# Patient Record
Sex: Male | Born: 1960
Health system: Southern US, Community
[De-identification: ages and names within clinical notes are randomized; demographics above are authoritative.]

## PROBLEM LIST (undated history)

## (undated) DIAGNOSIS — C801 Malignant (primary) neoplasm, unspecified: Secondary | ICD-10-CM

## (undated) DIAGNOSIS — I10 Essential (primary) hypertension: Secondary | ICD-10-CM

## (undated) DIAGNOSIS — H269 Unspecified cataract: Secondary | ICD-10-CM

## (undated) DIAGNOSIS — E11319 Type 2 diabetes mellitus with unspecified diabetic retinopathy without macular edema: Secondary | ICD-10-CM

## (undated) DIAGNOSIS — H35039 Hypertensive retinopathy, unspecified eye: Secondary | ICD-10-CM

## (undated) DIAGNOSIS — E119 Type 2 diabetes mellitus without complications: Secondary | ICD-10-CM

## (undated) HISTORY — DX: Essential (primary) hypertension: I10

## (undated) HISTORY — DX: Unspecified cataract: H26.9

## (undated) HISTORY — PX: CARPAL TUNNEL WITH CUBITAL TUNNEL: SHX5608

## (undated) HISTORY — DX: Malignant (primary) neoplasm, unspecified: C80.1

## (undated) HISTORY — DX: Hypertensive retinopathy, unspecified eye: H35.039

## (undated) HISTORY — PX: MOHS SURGERY: SHX181

## (undated) HISTORY — PX: ACHILLES TENDON SURGERY: SHX542

## (undated) HISTORY — DX: Type 2 diabetes mellitus with unspecified diabetic retinopathy without macular edema: E11.319

## (undated) HISTORY — PX: ANKLE SURGERY: SHX546

---

## 2018-10-17 DIAGNOSIS — Z20828 Contact with and (suspected) exposure to other viral communicable diseases: Secondary | ICD-10-CM | POA: Diagnosis not present

## 2018-10-29 DIAGNOSIS — C4442 Squamous cell carcinoma of skin of scalp and neck: Secondary | ICD-10-CM | POA: Diagnosis not present

## 2018-10-29 DIAGNOSIS — F5221 Male erectile disorder: Secondary | ICD-10-CM | POA: Diagnosis not present

## 2018-10-29 DIAGNOSIS — C4491 Basal cell carcinoma of skin, unspecified: Secondary | ICD-10-CM | POA: Diagnosis not present

## 2018-10-29 DIAGNOSIS — E1169 Type 2 diabetes mellitus with other specified complication: Secondary | ICD-10-CM | POA: Diagnosis not present

## 2018-10-29 DIAGNOSIS — Z0000 Encounter for general adult medical examination without abnormal findings: Secondary | ICD-10-CM | POA: Diagnosis not present

## 2018-10-29 DIAGNOSIS — I10 Essential (primary) hypertension: Secondary | ICD-10-CM | POA: Diagnosis not present

## 2020-05-06 DIAGNOSIS — U071 COVID-19: Secondary | ICD-10-CM | POA: Diagnosis not present

## 2020-06-23 ENCOUNTER — Other Ambulatory Visit: Payer: Self-pay | Admitting: Family Medicine

## 2020-06-23 DIAGNOSIS — E119 Type 2 diabetes mellitus without complications: Secondary | ICD-10-CM | POA: Diagnosis not present

## 2020-06-23 DIAGNOSIS — K219 Gastro-esophageal reflux disease without esophagitis: Secondary | ICD-10-CM | POA: Diagnosis not present

## 2020-07-01 ENCOUNTER — Other Ambulatory Visit: Payer: Self-pay

## 2020-07-22 ENCOUNTER — Ambulatory Visit
Admission: RE | Admit: 2020-07-22 | Discharge: 2020-07-22 | Disposition: A | Discharge status: Home / Self Care | Payer: BC Managed Care – PPO | Source: Ambulatory Visit | Attending: Family Medicine | Admitting: Family Medicine

## 2020-07-22 ENCOUNTER — Other Ambulatory Visit: Payer: Self-pay

## 2020-07-22 DIAGNOSIS — K219 Gastro-esophageal reflux disease without esophagitis: Secondary | ICD-10-CM | POA: Diagnosis not present

## 2020-07-29 DIAGNOSIS — Z7984 Long term (current) use of oral hypoglycemic drugs: Secondary | ICD-10-CM | POA: Diagnosis not present

## 2020-07-29 DIAGNOSIS — Z0000 Encounter for general adult medical examination without abnormal findings: Secondary | ICD-10-CM | POA: Diagnosis not present

## 2020-07-29 DIAGNOSIS — E119 Type 2 diabetes mellitus without complications: Secondary | ICD-10-CM | POA: Diagnosis not present

## 2020-07-29 DIAGNOSIS — Z79899 Other long term (current) drug therapy: Secondary | ICD-10-CM | POA: Diagnosis not present

## 2020-07-29 DIAGNOSIS — Z125 Encounter for screening for malignant neoplasm of prostate: Secondary | ICD-10-CM | POA: Diagnosis not present

## 2020-08-06 DIAGNOSIS — E1165 Type 2 diabetes mellitus with hyperglycemia: Secondary | ICD-10-CM | POA: Diagnosis not present

## 2020-08-06 DIAGNOSIS — Z794 Long term (current) use of insulin: Secondary | ICD-10-CM | POA: Diagnosis not present

## 2020-08-06 DIAGNOSIS — Z6833 Body mass index (BMI) 33.0-33.9, adult: Secondary | ICD-10-CM | POA: Diagnosis not present

## 2020-08-06 DIAGNOSIS — E1169 Type 2 diabetes mellitus with other specified complication: Secondary | ICD-10-CM | POA: Diagnosis not present

## 2020-08-10 ENCOUNTER — Ambulatory Visit: Payer: BC Managed Care – PPO | Admitting: Endocrinology

## 2020-09-29 DIAGNOSIS — Z6833 Body mass index (BMI) 33.0-33.9, adult: Secondary | ICD-10-CM | POA: Diagnosis not present

## 2020-09-29 DIAGNOSIS — E1165 Type 2 diabetes mellitus with hyperglycemia: Secondary | ICD-10-CM | POA: Diagnosis not present

## 2020-09-29 DIAGNOSIS — E1169 Type 2 diabetes mellitus with other specified complication: Secondary | ICD-10-CM | POA: Diagnosis not present

## 2020-09-29 DIAGNOSIS — E1141 Type 2 diabetes mellitus with diabetic mononeuropathy: Secondary | ICD-10-CM | POA: Diagnosis not present

## 2020-09-29 DIAGNOSIS — Z794 Long term (current) use of insulin: Secondary | ICD-10-CM | POA: Diagnosis not present

## 2020-10-01 DIAGNOSIS — H25812 Combined forms of age-related cataract, left eye: Secondary | ICD-10-CM | POA: Diagnosis not present

## 2020-10-01 DIAGNOSIS — H2511 Age-related nuclear cataract, right eye: Secondary | ICD-10-CM | POA: Diagnosis not present

## 2020-10-01 DIAGNOSIS — H10413 Chronic giant papillary conjunctivitis, bilateral: Secondary | ICD-10-CM | POA: Diagnosis not present

## 2020-10-01 DIAGNOSIS — E113491 Type 2 diabetes mellitus with severe nonproliferative diabetic retinopathy without macular edema, right eye: Secondary | ICD-10-CM | POA: Diagnosis not present

## 2020-10-01 DIAGNOSIS — E113412 Type 2 diabetes mellitus with severe nonproliferative diabetic retinopathy with macular edema, left eye: Secondary | ICD-10-CM | POA: Diagnosis not present

## 2020-10-05 NOTE — Progress Notes (Signed)
[facility] Note  10/06/2020     CHIEF COMPLAINT Patient presents for Diabetic Eye Exam   HISTORY OF PRESENT ILLNESS: Jason Bowen is a 60 y.o. male who presents to the clinic today for:   HPI     Diabetic Eye Exam   Diabetes characteristics include Type 2 and on insulin.  This started 20 years ago.  Blood sugar level is uncontrolled.  Last Blood Glucose 123.  Last A1C 12.  I, the attending physician,  performed the HPI with the patient and updated documentation appropriately.        Comments   Retina eval per [name] for "leakage in left eye" states pt.  Patient has notice the past couple of years the vision OS has weakened.  Around Easter of this year, pt ran out of Humalog for about 8 days.  He had moved and had to get a new doctor here.  He has struggled with BS since then.  Last A1C was May 2022.  He will have A1C rechecked in 5 weeks.       Last edited by [name], MD on 10/07/2020 12:12 AM.    Pt is here on the referral of [name] for concern of NPDR OU, pt states he saw her for a routine eye exam, pt states he has not noticed any change in vision or any floaters, pt states his A1c in May was over 12 bc he states over Easter he was without Humalog for 8 days due to moving and trying to find a new dr, he states his BG has been "good", he just started seeing a new endocrinologist who put him on a trial of Jardiance, pt states his morning sugars have been great since starting it, but his insurance company doesn't cover it  Referring physician: Virgina Evener, [address] [place],  [place] [address]  HISTORICAL INFORMATION:   Selected notes from the MEDICAL RECORD NUMBER Referred by [name] for eval of DR w/DME OS   CURRENT MEDICATIONS: No current outpatient medications on file. (Ophthalmic Drugs)   No current facility-administered medications for this visit. (Ophthalmic Drugs)   Current Outpatient Medications  (Other)  Medication Sig   dapagliflozin propanediol (FARXIGA) 10 MG TABS tablet Take 10 mg by mouth daily.   Insulin Degludec (TRESIBA) 100 UNIT/ML SOLN Inject 36 Units into the skin daily.   insulin lispro (HUMALOG) 100 UNIT/ML injection Inject 15 Units into the skin 3 (three) times daily before meals.   lisinopril (ZESTRIL) 20 MG tablet Take 20 mg by mouth daily.   metFORMIN (GLUCOPHAGE) 500 MG tablet Take 500 mg by mouth 2 (two) times daily with a meal.   No current facility-administered medications for this visit. (Other)      REVIEW OF SYSTEMS: ROS   Positive for: Skin, Musculoskeletal, Endocrine, Eyes Negative for: Constitutional, Gastrointestinal, Neurological, Genitourinary, HENT, Cardiovascular, Respiratory, Psychiatric, Allergic/Imm, Heme/Lymph Last edited by [name], COA on 10/06/2020  9:29 AM.       ALLERGIES No Known Allergies  PAST MEDICAL HISTORY Past Medical History:  Diagnosis Date   Cancer ([place])    Hypertension    Past Surgical History:  Procedure Laterality Date   ACHILLES TENDON SURGERY     ANKLE SURGERY     CARPAL TUNNEL WITH CUBITAL TUNNEL     MOHS SURGERY     has had 20+ surgeries for this on different areas of body    FAMILY HISTORY  Family History  Problem Relation Age of Onset   Hypertension Mother    Diabetes Mother    Cataracts Mother    Cancer Paternal Grandmother     SOCIAL HISTORY Social History   Tobacco Use   Smoking status: Never   Smokeless tobacco: Never  Substance Use Topics   Alcohol use: Not Currently         OPHTHALMIC EXAM:  Base Eye Exam     Visual Acuity (Snellen - Linear)       Right Left   Dist cc 20/30 +2 20/150 +2   Dist ph cc NI 20/50 -2         Tonometry (Tonopen, 9:45 AM)       Right Left   Pressure 15 17         Pupils       Dark Light Shape React APD   Right 3 2 Round Brisk None   Left 3 2 Round Brisk None         Visual Fields (Counting fingers)       Left Right     Full Full         Extraocular Movement       Right Left    Full Full         Neuro/Psych     Oriented x3: Yes   Mood/Affect: Normal         Dilation     Both eyes: 1.0% Mydriacyl, 2.5% Phenylephrine @ 9:45 AM           Slit Lamp and Fundus Exam     Slit Lamp Exam       Right Left   Lids/Lashes Dermatochalasis - upper lid Dermatochalasis - upper lid   Conjunctiva/Sclera White and quiet White and quiet   Cornea arcus arcus   Anterior Chamber deep, clear, narrow temporal angle deep, clear, narrow temporal angle   Iris Round and dilated, No NVI Round and dilated, No NVI   Lens Clear Trace Posterior subcapsular cataract   Vitreous mild syneresis mild syneresis         Fundus Exam       Right Left   Disc Pink and Sharp mild Pallor, Sharp rim   C/D Ratio 0.2 0.2   Macula Flat, blunted foveal reflex, +edema; scattered MA/DBH, boat shaped subhyalod heme IT to fovea Blunted foveal reflex, circinate exudates and edema ST macula, scattered MA/DBH   Vessels attenuated, mild tortuousity, scattered, early NV attenuated, mild tortuousity, scattered, early NV greatest superior to disc   Periphery Attached, 360 DBH, patches of NVE    Attached, 360 DBH, patches of NVE              Refraction     Wearing Rx       Sphere Cylinder Add   Right +1.75 Sphere +2.50   Left +1.75 Sphere +2.50         Manifest Refraction       Sphere Cylinder Axis Dist VA   Right +1.75 +0.50 030 20/20   Left +0.75 Sphere  20/70+1            IMAGING AND PROCEDURES  Imaging and Procedures for 10/06/2020  OCT, Retina - OU - Both Eyes       Right Eye Quality was good. Central Foveal Thickness: 276. Progression has no prior data. Findings include abnormal foveal contour, intraretinal fluid, intraretinal hyper-reflective material, no SRF (Subhyaloid heme inferior macula).   Left Eye Quality  was good. Central Foveal Thickness: 314. Progression has no prior data. Findings  include abnormal foveal contour, intraretinal fluid, intraretinal hyper-reflective material, no SRF, vitreomacular adhesion .   Notes *Images captured and stored on drive  Diagnosis / Impression:  OD: DME with subhyaloid heme inferior macula OS: severe temporal DME  Clinical management:  See below  Abbreviations: NFP - Normal foveal profile. CME - cystoid macular edema. PED - pigment epithelial detachment. IRF - intraretinal fluid. SRF - subretinal fluid. EZ - ellipsoid zone. ERM - epiretinal membrane. ORA - outer retinal atrophy. ORT - outer retinal tubulation. SRHM - subretinal hyper-reflective material. IRHM - intraretinal hyper-reflective material      Fluorescein Angiography Optos (Transit OS)       Right Eye Progression has no prior data. Early phase findings include delayed filling, microaneurysm, blockage, retinal neovascularization, vascular perfusion defect. Mid/Late phase findings include blockage, microaneurysm, leakage, vascular perfusion defect, retinal neovascularization (Scattered patches of NVE greatest inferiorly and temporal macula).   Left Eye Progression has no prior data. Early phase findings include vascular perfusion defect, retinal neovascularization, microaneurysm. Mid/Late phase findings include retinal neovascularization, pigment epithelial detachment, microaneurysm, leakage (Scattered patches of NVE greatest nasal hemisphere).   Notes **Images stored on drive**  Impression: PDR w/ vascular perfusion defects and leaking MA OU OD: Scattered patches of NVE greatest inferiorly and temporal macula OS: Scattered patches of NVE greatest nasal hemisphere              ASSESSMENT/PLAN:    ICD-10-CM   1. Proliferative diabetic retinopathy of both eyes with macular edema associated with type 2 diabetes mellitus ([place])  G92.1194     2. Retinal edema  H35.81 OCT, Retina - OU - Both Eyes    3. Essential hypertension  I10     4. Hypertensive retinopathy  of both eyes  H35.033 Fluorescein Angiography Optos (Transit OS)    5. Combined forms of age-related cataract of both eyes  H25.813       1,2. Proliferative diabetic retinopathy, both eyes  - history of poor control - The incidence, risk factors for progression, natural history and treatment options for diabetic retinopathy were discussed with patient.   - The need for close monitoring of blood glucose, blood pressure, and serum lipids, avoiding cigarette or any type of tobacco, and the need for long term follow up was also discussed with patient. - exam shows scattered [place] and NVE OU; OD with small subhyaloid heme - FA (07.12.22) shows scattered patches of NVE OU - OCT shows diabetic macular edema, both eyes  - The natural history, pathology, and characteristics of diabetic macular edema discussed with patient.  A generalized discussion of the major clinical trials concerning treatment of diabetic macular edema (ETDRS, DCT, SCORE, RISE / RIDE, and ongoing DRCR net studies) was completed.  This discussion included mention of the various approaches to treating diabetic macular edema (observation, laser photocoagulation, anti-VEGF injections with lucentis / Avastin / Eylea, steroid injections with Kenalog / Ozurdex, and intraocular surgery with vitrectomy).  The goal hemoglobin A1C of 6-7 was discussed, as well as importance of smoking cessation and hypertension control.  Need for ongoing treatment and monitoring were specifically discussed with reference to chronic nature of diabetic macular edema. - discussed findings, severity of retinopathy, prognosis and treatment options - discussed likely need for laser and injections OU to stabilize retinopathy and preserve and regain vision - recommend PRP OD first today, 07.12.22 - pt wishes to come back at a later date  for laser OD - f/u in 1-2 wks -- DFE/OCT, PRP OD  3,4. Hypertensive retinopathy OU - discussed importance of tight BP control -  monitor  5. Mixed Cataract OU - The symptoms of cataract, surgical options, and treatments and risks were discussed with patient. - discussed diagnosis and progression - not yet visually significant - monitor for now  Ophthalmic Meds Ordered this visit:  No orders of the defined types were placed in this encounter.      Return for f/u 1-2 weeks, PDR OU, DFE, OCT.  There are no Patient Instructions on file for this visit.   Explained the diagnoses, plan, and follow up with the patient and they expressed understanding.  Patient expressed understanding of the importance of proper follow up care.   This document serves as a record of services personally performed by Gardiner Sleeper, MD, PhD. It was created on their behalf by Estill Bakes, COT an ophthalmic technician. The creation of this record is the provider's dictation and/or activities during the visit.    Electronically signed by: Estill Bakes, COT 7.11.22 @ 12:19 AM   Gardiner Sleeper, M.D., Ph.D. Diseases & Surgery of the Retina and Vitreous [facility]  I have reviewed the above documentation for accuracy and completeness, and I agree with the above. Gardiner Sleeper, M.D., Ph.D. 10/07/20 12:19 AM   Abbreviations: M myopia (nearsighted); A astigmatism; H hyperopia (farsighted); P presbyopia; Mrx spectacle prescription;  CTL contact lenses; OD right eye; OS left eye; OU both eyes  XT exotropia; ET esotropia; PEK punctate epithelial keratitis; PEE punctate epithelial erosions; DES dry eye syndrome; MGD meibomian gland dysfunction; ATs artificial tears; PFAT's preservative free artificial tears; [place] nuclear sclerotic cataract; PSC posterior subcapsular cataract; ERM epi-retinal membrane; PVD posterior vitreous detachment; RD retinal detachment; DM diabetes mellitus; DR diabetic retinopathy; NPDR non-proliferative diabetic retinopathy; PDR proliferative diabetic retinopathy; CSME clinically significant macular  edema; DME diabetic macular edema; dbh dot blot hemorrhages; CWS cotton wool spot; POAG primary open angle glaucoma; C/D cup-to-disc ratio; HVF humphrey visual field; GVF goldmann visual field; OCT optical coherence tomography; IOP intraocular pressure; BRVO Branch retinal vein occlusion; CRVO central retinal vein occlusion; CRAO central retinal artery occlusion; BRAO branch retinal artery occlusion; RT retinal tear; SB scleral buckle; PPV pars plana vitrectomy; VH Vitreous hemorrhage; PRP panretinal laser photocoagulation; IVK intravitreal kenalog; VMT vitreomacular traction; MH Macular hole;  NVD neovascularization of the disc; NVE neovascularization elsewhere; AREDS age related eye disease study; ARMD age related macular degeneration; POAG primary open angle glaucoma; EBMD epithelial/anterior basement membrane dystrophy; ACIOL anterior chamber intraocular lens; IOL intraocular lens; PCIOL posterior chamber intraocular lens; Phaco/IOL phacoemulsification with intraocular lens placement; [place] photorefractive keratectomy; LASIK laser assisted in situ keratomileusis; HTN hypertension; DM diabetes mellitus; COPD chronic obstructive pulmonary disease

## 2020-10-06 ENCOUNTER — Encounter (INDEPENDENT_AMBULATORY_CARE_PROVIDER_SITE_OTHER): Payer: Self-pay | Admitting: Ophthalmology

## 2020-10-06 ENCOUNTER — Ambulatory Visit (INDEPENDENT_AMBULATORY_CARE_PROVIDER_SITE_OTHER): Payer: BC Managed Care – PPO | Admitting: Ophthalmology

## 2020-10-06 ENCOUNTER — Other Ambulatory Visit: Payer: Self-pay

## 2020-10-06 DIAGNOSIS — H35033 Hypertensive retinopathy, bilateral: Secondary | ICD-10-CM

## 2020-10-06 DIAGNOSIS — I10 Essential (primary) hypertension: Secondary | ICD-10-CM | POA: Diagnosis not present

## 2020-10-06 DIAGNOSIS — H25813 Combined forms of age-related cataract, bilateral: Secondary | ICD-10-CM

## 2020-10-06 DIAGNOSIS — H3581 Retinal edema: Secondary | ICD-10-CM | POA: Diagnosis not present

## 2020-10-06 DIAGNOSIS — E113513 Type 2 diabetes mellitus with proliferative diabetic retinopathy with macular edema, bilateral: Secondary | ICD-10-CM | POA: Diagnosis not present

## 2020-10-07 ENCOUNTER — Ambulatory Visit: Payer: BC Managed Care – PPO | Admitting: Endocrinology

## 2020-10-12 NOTE — Progress Notes (Signed)
[facility] Note  10/14/2020     CHIEF COMPLAINT Patient presents for Retina Follow Up   HISTORY OF PRESENT ILLNESS: Jason Bowen is a 60 y.o. male who presents to the clinic today for:   HPI     Retina Follow Up   Patient presents with  Diabetic Retinopathy.  In both eyes.  Duration of 1 week.  Since onset it is stable.  I, the attending physician,  performed the HPI with the patient and updated documentation appropriately.        Comments   1 week follow up PDR OU, PRP OD today- Vision appears stable OU.  BS 122 this am A1C unsure      Last edited by [name], MD on 10/14/2020  4:46 PM.       Referring physician: No referring provider defined for this encounter.  HISTORICAL INFORMATION:   Selected notes from the MEDICAL RECORD NUMBER Referred by [name] for eval of DR w/DME OS   CURRENT MEDICATIONS: Current Outpatient Medications (Ophthalmic Drugs)  Medication Sig   prednisoLONE acetate (PRED FORTE) 1 % ophthalmic suspension Place 1 drop into the right eye 4 (four) times daily for 7 days.   No current facility-administered medications for this visit. (Ophthalmic Drugs)   Current Outpatient Medications (Other)  Medication Sig   dapagliflozin propanediol (FARXIGA) 10 MG TABS tablet Take 10 mg by mouth daily.   Insulin Degludec (TRESIBA) 100 UNIT/ML SOLN Inject 36 Units into the skin daily.   insulin lispro (HUMALOG) 100 UNIT/ML injection Inject 15 Units into the skin 3 (three) times daily before meals.   lisinopril (ZESTRIL) 20 MG tablet Take 20 mg by mouth daily.   metFORMIN (GLUCOPHAGE) 500 MG tablet Take 500 mg by mouth 2 (two) times daily with a meal.   No current facility-administered medications for this visit. (Other)      REVIEW OF SYSTEMS: ROS   Positive for: Endocrine, Eyes Negative for: Constitutional, Gastrointestinal, Neurological, Skin, Genitourinary, Musculoskeletal, HENT, Cardiovascular, Respiratory,  Psychiatric, Allergic/Imm, Heme/Lymph Last edited by [name], COA on 10/14/2020  9:12 AM.        ALLERGIES No Known Allergies  PAST MEDICAL HISTORY Past Medical History:  Diagnosis Date   Cancer ([place])    Hypertension    Past Surgical History:  Procedure Laterality Date   ACHILLES TENDON SURGERY     ANKLE SURGERY     CARPAL TUNNEL WITH CUBITAL TUNNEL     MOHS SURGERY     has had 20+ surgeries for this on different areas of body    FAMILY HISTORY Family History  Problem Relation Age of Onset   Hypertension Mother    Diabetes Mother    Cataracts Mother    Cancer Paternal Grandmother     SOCIAL HISTORY Social History   Tobacco Use   Smoking status: Never   Smokeless tobacco: Never  Substance Use Topics   Alcohol use: Not Currently         OPHTHALMIC EXAM:  Base Eye Exam     Visual Acuity (Snellen - Linear)       Right Left   Dist cc 20/30 20/50-   Dist ph cc  NI    Correction: Glasses  OS- looking to the left        Tonometry (Tonopen, 9:17 AM)       Right Left   Pressure 17 19         Pupils  Dark Light Shape React APD   Right 3 2 Round Brisk None   Left 3 2 Round Brisk None         Visual Fields (Counting fingers)       Left Right    Full Full         Extraocular Movement       Right Left    Full Full         Neuro/Psych     Oriented x3: Yes   Mood/Affect: Normal         Dilation     Right eye: 1.0% Mydriacyl, 2.5% Phenylephrine @ 9:17 AM           Slit Lamp and Fundus Exam     Slit Lamp Exam       Right Left   Lids/Lashes Dermatochalasis - upper lid Dermatochalasis - upper lid   Conjunctiva/Sclera White and quiet White and quiet   Cornea arcus arcus   Anterior Chamber deep, clear, narrow temporal angle deep, clear, narrow temporal angle   Iris Round and dilated, No NVI Round and dilated, No NVI   Lens Clear Trace Posterior subcapsular cataract   Vitreous mild syneresis mild syneresis          Fundus Exam       Right Left   Disc Pink and Sharp mild Pallor, Sharp rim   C/D Ratio 0.2 0.2   Macula Flat, blunted foveal reflex, +edema; scattered MA/DBH, boat shaped subhyalod heme IT to fovea Blunted foveal reflex, circinate exudates and edema ST macula, scattered MA/DBH   Vessels attenuated, mild tortuousity, scattered, early NV attenuated, mild tortuousity, scattered, early NV greatest superior to disc   Periphery Attached, 360 DBH, patches of NVE    Attached, 360 DBH, patches of NVE              Refraction     Wearing Rx       Sphere Cylinder Add   Right +1.75 Sphere +2.50   Left +1.75 Sphere +2.50            IMAGING AND PROCEDURES  Imaging and Procedures for 10/14/2020  OCT, Retina - OU - Both Eyes       Right Eye Quality was good. Central Foveal Thickness: 282. Progression has been stable. Findings include abnormal foveal contour, intraretinal fluid, intraretinal hyper-reflective material, no SRF (Subhyaloid heme inferior macula, persistent cystic changes / edema temporal macula).   Left Eye Quality was good. Central Foveal Thickness: 316. Progression has been stable. Findings include abnormal foveal contour, intraretinal fluid, intraretinal hyper-reflective material, no SRF, vitreomacular adhesion (Persistent edema temporal macula).   Notes *Images captured and stored on drive  Diagnosis / Impression:  +DME OU OD: persistent cystic changes / edema temporal macula OS: Persistent edema temporal macula  Clinical management:  See below  Abbreviations: NFP - Normal foveal profile. CME - cystoid macular edema. PED - pigment epithelial detachment. IRF - intraretinal fluid. SRF - subretinal fluid. EZ - ellipsoid zone. ERM - epiretinal membrane. ORA - outer retinal atrophy. ORT - outer retinal tubulation. SRHM - subretinal hyper-reflective material. IRHM - intraretinal hyper-reflective material      Panretinal Photocoagulation - OD - Right Eye        Time Out Confirmed correct patient, procedure, site, and patient consented.   Anesthesia Topical anesthesia was used. Anesthetic medications included Proparacaine 0.5%.   Post-op The patient tolerated the procedure well. There were no complications. The patient received written and verbal  post procedure care education.   Notes LASER PROCEDURE NOTE  Diagnosis:   Proliferative Diabetic Retinopathy, RIGHT EYE  Procedure:  Pan-retinal photocoagulation using slit lamp laser, RIGHT EYE  Anesthesia:  Topical  Surgeon: [name], MD, PhD   Informed consent obtained, operative eye marked, and time out performed prior to initiation of laser.   Lumenis [id number] slit lamp laser Pattern: 3x3 square Power: 250 mW Duration: 30 msec  Spot size: 200 microns  # spots: 6962 spots   Complications: None.  RTC: 1-2 wks for DFE/OCT -- PRP OS vs anti-VEGF  Patient tolerated the procedure well and received written and verbal post-procedure care information/education.               ASSESSMENT/PLAN:    ICD-10-CM   1. Proliferative diabetic retinopathy of both eyes with macular edema associated with type 2 diabetes mellitus ([place])  X52.8413 Panretinal Photocoagulation - OD - Right Eye    2. Retinal edema  H35.81 OCT, Retina - OU - Both Eyes    3. Essential hypertension  I10     4. Hypertensive retinopathy of both eyes  H35.033     5. Combined forms of age-related cataract of both eyes  H25.813        1,2. Proliferative diabetic retinopathy, both eyes  - history of poor BG control - The incidence, risk factors for progression, natural history and treatment options for diabetic retinopathy were discussed with patient.   - The need for close monitoring of blood glucose, blood pressure, and serum lipids, avoiding cigarette or any type of tobacco, and the need for long term follow up was also discussed with patient. - exam shows scattered [place] and NVE OU; OD with small  subhyaloid heme - FA (07.12.22) shows scattered patches of NVE OU - OCT shows diabetic macular edema, both eyes  - discussed findings, severity of retinopathy, prognosis and treatment options - discussed likely need for laser and injections OU to stabilize retinopathy and preserve and/or regain vision - recommend PRP OD first today, 07.20.22  - pt wishes to proceed with laser PRP OD - RBA of procedure discussed, questions answered - informed consent obtained and signed - see procedure note  - start PF QID OD x7 days - f/u in 1-2 wks -- DFE/OCT, PRP OS vs anti-VEGF  3,4. Hypertensive retinopathy OU - discussed importance of tight BP control - monitor  5. Mixed Cataract OU - The symptoms of cataract, surgical options, and treatments and risks were discussed with patient. - discussed diagnosis and progression - not yet visually significant - monitor for now  Ophthalmic Meds Ordered this visit:  Meds ordered this encounter  Medications   prednisoLONE acetate (PRED FORTE) 1 % ophthalmic suspension    Sig: Place 1 drop into the right eye 4 (four) times daily for 7 days.    Dispense:  10 mL    Refill:  0      Return for 1-2 wks - PDR OU, Dilated Exam, OCT -- PRP OS vs anti-VEGF.  There are no Patient Instructions on file for this visit.   Explained the diagnoses, plan, and follow up with the patient and they expressed understanding.  Patient expressed understanding of the importance of proper follow up care.   This document serves as a record of services personally performed by Gardiner Sleeper, MD, PhD. It was created on their behalf by Roselee Nova, COMT. The creation of this record is the provider's dictation and/or activities during the visit.  Electronically signed by: Roselee Nova, COMT 10/14/20 4:52 PM  Gardiner Sleeper, M.D., Ph.D. Diseases & Surgery of the Retina and Vitreous [facility]  I have reviewed the above documentation for accuracy and  completeness, and I agree with the above. Gardiner Sleeper, M.D., Ph.D. 10/14/20 4:52 PM  Abbreviations: M myopia (nearsighted); A astigmatism; H hyperopia (farsighted); P presbyopia; Mrx spectacle prescription;  CTL contact lenses; OD right eye; OS left eye; OU both eyes  XT exotropia; ET esotropia; PEK punctate epithelial keratitis; PEE punctate epithelial erosions; DES dry eye syndrome; MGD meibomian gland dysfunction; ATs artificial tears; PFAT's preservative free artificial tears; [place] nuclear sclerotic cataract; PSC posterior subcapsular cataract; ERM epi-retinal membrane; PVD posterior vitreous detachment; RD retinal detachment; DM diabetes mellitus; DR diabetic retinopathy; NPDR non-proliferative diabetic retinopathy; PDR proliferative diabetic retinopathy; CSME clinically significant macular edema; DME diabetic macular edema; dbh dot blot hemorrhages; CWS cotton wool spot; POAG primary open angle glaucoma; C/D cup-to-disc ratio; HVF humphrey visual field; GVF goldmann visual field; OCT optical coherence tomography; IOP intraocular pressure; BRVO Branch retinal vein occlusion; CRVO central retinal vein occlusion; CRAO central retinal artery occlusion; BRAO branch retinal artery occlusion; RT retinal tear; SB scleral buckle; PPV pars plana vitrectomy; VH Vitreous hemorrhage; PRP panretinal laser photocoagulation; IVK intravitreal kenalog; VMT vitreomacular traction; MH Macular hole;  NVD neovascularization of the disc; NVE neovascularization elsewhere; AREDS age related eye disease study; ARMD age related macular degeneration; POAG primary open angle glaucoma; EBMD epithelial/anterior basement membrane dystrophy; ACIOL anterior chamber intraocular lens; IOL intraocular lens; PCIOL posterior chamber intraocular lens; Phaco/IOL phacoemulsification with intraocular lens placement; [place] photorefractive keratectomy; LASIK laser assisted in situ keratomileusis; HTN hypertension; DM diabetes mellitus; COPD chronic  obstructive pulmonary disease

## 2020-10-14 ENCOUNTER — Other Ambulatory Visit: Payer: Self-pay

## 2020-10-14 ENCOUNTER — Ambulatory Visit (INDEPENDENT_AMBULATORY_CARE_PROVIDER_SITE_OTHER): Payer: BC Managed Care – PPO | Admitting: Ophthalmology

## 2020-10-14 ENCOUNTER — Encounter (INDEPENDENT_AMBULATORY_CARE_PROVIDER_SITE_OTHER): Payer: Self-pay | Admitting: Ophthalmology

## 2020-10-14 DIAGNOSIS — E113513 Type 2 diabetes mellitus with proliferative diabetic retinopathy with macular edema, bilateral: Secondary | ICD-10-CM | POA: Diagnosis not present

## 2020-10-14 DIAGNOSIS — H3581 Retinal edema: Secondary | ICD-10-CM

## 2020-10-14 DIAGNOSIS — H35033 Hypertensive retinopathy, bilateral: Secondary | ICD-10-CM | POA: Diagnosis not present

## 2020-10-14 DIAGNOSIS — H25813 Combined forms of age-related cataract, bilateral: Secondary | ICD-10-CM

## 2020-10-14 DIAGNOSIS — I10 Essential (primary) hypertension: Secondary | ICD-10-CM

## 2020-10-14 MED ORDER — PREDNISOLONE ACETATE 1 % OP SUSP: 1.0000 [drp] | Freq: Four times a day (QID) | OPHTHALMIC | 0 refills | Status: AC

## 2020-10-21 ENCOUNTER — Encounter (INDEPENDENT_AMBULATORY_CARE_PROVIDER_SITE_OTHER): Payer: BC Managed Care – PPO | Admitting: Ophthalmology

## 2020-10-21 DIAGNOSIS — I10 Essential (primary) hypertension: Secondary | ICD-10-CM

## 2020-10-21 DIAGNOSIS — H3581 Retinal edema: Secondary | ICD-10-CM

## 2020-10-21 DIAGNOSIS — H25813 Combined forms of age-related cataract, bilateral: Secondary | ICD-10-CM

## 2020-10-21 DIAGNOSIS — H35033 Hypertensive retinopathy, bilateral: Secondary | ICD-10-CM

## 2020-10-21 DIAGNOSIS — E113513 Type 2 diabetes mellitus with proliferative diabetic retinopathy with macular edema, bilateral: Secondary | ICD-10-CM

## 2020-10-21 NOTE — Progress Notes (Signed)
[facility] Note  10/28/2020     CHIEF COMPLAINT Patient presents for Retina Follow Up   HISTORY OF PRESENT ILLNESS: Jason Bowen is a 60 y.o. male who presents to the clinic today for:   HPI     Retina Follow Up   Patient presents with  Diabetic Retinopathy.  In right eye.  Duration of 3 weeks.  Since onset it is stable.  I, the attending physician,  performed the HPI with the patient and updated documentation appropriately.        Comments   Pt here for 3 wk ret f/u PRP OS for PDR OU. Pt states vision is doing well, no changes or issues noted. Reports completing full week of PF QID OD. No ocular pain or discomfort.       Last edited by [name], MD on 10/28/2020 11:01 AM.      Referring physician: No referring provider defined for this encounter.  HISTORICAL INFORMATION:   Selected notes from the MEDICAL RECORD NUMBER Referred by [name] for eval of DR w/DME OS   CURRENT MEDICATIONS: No current outpatient medications on file. (Ophthalmic Drugs)   No current facility-administered medications for this visit. (Ophthalmic Drugs)   Current Outpatient Medications (Other)  Medication Sig   dapagliflozin propanediol (FARXIGA) 10 MG TABS tablet Take 10 mg by mouth daily.   Insulin Degludec (TRESIBA) 100 UNIT/ML SOLN Inject 36 Units into the skin daily.   insulin lispro (HUMALOG) 100 UNIT/ML injection Inject 15 Units into the skin 3 (three) times daily before meals.   lisinopril (ZESTRIL) 20 MG tablet Take 20 mg by mouth daily.   metFORMIN (GLUCOPHAGE) 500 MG tablet Take 500 mg by mouth 2 (two) times daily with a meal.   No current facility-administered medications for this visit. (Other)      REVIEW OF SYSTEMS: ROS   Positive for: Endocrine, Eyes Negative for: Constitutional, Gastrointestinal, Neurological, Skin, Genitourinary, Musculoskeletal, HENT, Cardiovascular, Respiratory, Psychiatric, Allergic/Imm, Heme/Lymph Last edited by  Kingsley Spittle, COT on 10/28/2020  8:03 AM.         ALLERGIES No Known Allergies  PAST MEDICAL HISTORY Past Medical History:  Diagnosis Date   Cancer ([place])    Hypertension    Past Surgical History:  Procedure Laterality Date   ACHILLES TENDON SURGERY     ANKLE SURGERY     CARPAL TUNNEL WITH CUBITAL TUNNEL     MOHS SURGERY     has had 20+ surgeries for this on different areas of body    FAMILY HISTORY Family History  Problem Relation Age of Onset   Hypertension Mother    Diabetes Mother    Cataracts Mother    Cancer Paternal Grandmother     SOCIAL HISTORY Social History   Tobacco Use   Smoking status: Never   Smokeless tobacco: Never  Substance Use Topics   Alcohol use: Not Currently         OPHTHALMIC EXAM:  Base Eye Exam     Visual Acuity (Snellen - Linear)       Right Left   Dist cc 20/30 +2 20/70   Dist ph cc NI 20/40 -2    Correction: Glasses         Tonometry (Tonopen, 8:12 AM)       Right Left   Pressure 14 15         Pupils       Dark Light Shape React APD  Right 3 2 Round Brisk None   Left 3 2 Round Brisk None         Visual Fields (Counting fingers)       Left Right    Full Full         Extraocular Movement       Right Left    Full Full         Neuro/Psych     Oriented x3: Yes   Mood/Affect: Normal         Dilation     Both eyes: 1.0% Mydriacyl, 2.5% Phenylephrine @ 8:14 AM           Slit Lamp and Fundus Exam     Slit Lamp Exam       Right Left   Lids/Lashes Dermatochalasis - upper lid Dermatochalasis - upper lid   Conjunctiva/Sclera White and quiet White and quiet   Cornea arcus arcus   Anterior Chamber deep, clear, narrow temporal angle deep, clear, narrow temporal angle   Iris Round and dilated, No NVI Round and dilated, No NVI   Lens Clear Trace Posterior subcapsular cataract   Vitreous mild syneresis mild syneresis         Fundus Exam       Right Left   Disc Pink and  Sharp mild Pallor, Sharp rim   C/D Ratio 0.2 0.2   Macula Flat, blunted foveal reflex, +edema; scattered MA/DBH, boat shaped subhyalod heme IT to fovea Blunted foveal reflex, circinate exudates and edema ST macula, scattered MA/DBH   Vessels attenuated, mild tortuousity, scattered, early NV attenuated, mild tortuousity, scattered, early NV greatest superior to disc   Periphery Attached, 360 DBH, patches of NVE    Attached, 360 DBH, patches of NVE              Refraction     Wearing Rx       Sphere Cylinder Add   Right +1.75 Sphere +2.50   Left +1.75 Sphere +2.50            IMAGING AND PROCEDURES  Imaging and Procedures for 10/28/2020  OCT, Retina - OU - Both Eyes       Right Eye Quality was good. Central Foveal Thickness: 289. Progression has been stable. Findings include abnormal foveal contour, intraretinal fluid, intraretinal hyper-reflective material, no SRF (Subhyaloid heme inferior macula, persistent cystic changes / edema temporal macula -- both slightly improved).   Left Eye Quality was good. Central Foveal Thickness: 282. Progression has been stable. Findings include abnormal foveal contour, intraretinal fluid, intraretinal hyper-reflective material, no SRF, vitreomacular adhesion (Persistent edema temporal macula).   Notes *Images captured and stored on drive  Diagnosis / Impression:  +DME OU OD: persistent cystic changes / edema temporal macula -- improved OS: Persistent edema temporal macula  Clinical management:  See below  Abbreviations: NFP - Normal foveal profile. CME - cystoid macular edema. PED - pigment epithelial detachment. IRF - intraretinal fluid. SRF - subretinal fluid. EZ - ellipsoid zone. ERM - epiretinal membrane. ORA - outer retinal atrophy. ORT - outer retinal tubulation. SRHM - subretinal hyper-reflective material. IRHM - intraretinal hyper-reflective material      Panretinal Photocoagulation - OS - Left Eye       LASER PROCEDURE  NOTE  Diagnosis:   Proliferative Diabetic Retinopathy, LEFT EYE  Procedure:  Pan-retinal photocoagulation using slit lamp laser, LEFT EYE  Anesthesia:  Topical  Surgeon: [name], MD, PhD   Informed consent obtained, operative eye marked, and  time out performed prior to initiation of laser.   Lumenis [id number] slit lamp laser Pattern: 3x3 square Power: 260 mW Duration: 30 msec  Spot size: 200 microns  # spots: 1219 spots  Complications: None.  RTC: 2 wks -- DFE/OCT, possible injection(s)  Patient tolerated the procedure well and received written and verbal post-procedure care information/education.            ASSESSMENT/PLAN:    ICD-10-CM   1. Proliferative diabetic retinopathy of both eyes with macular edema associated with type 2 diabetes mellitus ([place])  X58.8325 Panretinal Photocoagulation - OS - Left Eye    2. Retinal edema  H35.81 OCT, Retina - OU - Both Eyes    3. Essential hypertension  I10     4. Hypertensive retinopathy of both eyes  H35.033     5. Combined forms of age-related cataract of both eyes  H25.813      1,2. Proliferative diabetic retinopathy, both eyes  - history of poor BG control - exam shows scattered IRH and NVE OU; OD with small subhyaloid heme - FA (07.12.22) shows scattered patches of NVE OU - OCT shows diabetic macular edema, both eyes  - discussed findings, severity of retinopathy, prognosis and treatment options - discussed likely need for laser and injections OU to stabilize retinopathy and preserve and/or regain vision - s/p PRP OD (07.20.22) - recommend PRP OS today, 08.03.22  - pt wishes to proceed with laser PRP OS - RBA of procedure discussed, questions answered - informed consent obtained and signed - see procedure note  - start PF QID OS x7 days - f/u in 2 wks -- DFE/OCT, possible anti-VEGF therapy  3,4. Hypertensive retinopathy OU - discussed importance of tight BP control - monitor  5. Mixed Cataract OU -  The symptoms of cataract, surgical options, and treatments and risks were discussed with patient. - discussed diagnosis and progression - not yet visually significant - monitor for now  Ophthalmic Meds Ordered this visit:  No orders of the defined types were placed in this encounter.     Return in about 2 weeks (around 11/11/2020) for f/u PDR OU, DFE, OCT.  There are no Patient Instructions on file for this visit.   Explained the diagnoses, plan, and follow up with the patient and they expressed understanding.  Patient expressed understanding of the importance of proper follow up care.   This document serves as a record of services personally performed by Gardiner Sleeper, MD, PhD. It was created on their behalf by Roselee Nova, COMT. The creation of this record is the provider's dictation and/or activities during the visit.  Electronically signed by: Roselee Nova, COMT 10/28/20 11:07 AM  This document serves as a record of services personally performed by Gardiner Sleeper, MD, PhD. It was created on their behalf by San Jetty. Owens Shark, OA an ophthalmic technician. The creation of this record is the provider's dictation and/or activities during the visit.    Electronically signed by: San Jetty. Owens Shark, [place] 08.03.2022 11:07 AM   Gardiner Sleeper, M.D., Ph.D. Diseases & Surgery of the Retina and Vitreous [facility]  I have reviewed the above documentation for accuracy and completeness, and I agree with the above. Gardiner Sleeper, M.D., Ph.D. 10/28/20 11:07 AM   Abbreviations: M myopia (nearsighted); A astigmatism; H hyperopia (farsighted); P presbyopia; Mrx spectacle prescription;  CTL contact lenses; OD right eye; OS left eye; OU both eyes  XT exotropia; ET esotropia; PEK punctate  epithelial keratitis; PEE punctate epithelial erosions; DES dry eye syndrome; MGD meibomian gland dysfunction; ATs artificial tears; PFAT's preservative free artificial tears; [place] nuclear  sclerotic cataract; PSC posterior subcapsular cataract; ERM epi-retinal membrane; PVD posterior vitreous detachment; RD retinal detachment; DM diabetes mellitus; DR diabetic retinopathy; NPDR non-proliferative diabetic retinopathy; PDR proliferative diabetic retinopathy; CSME clinically significant macular edema; DME diabetic macular edema; dbh dot blot hemorrhages; CWS cotton wool spot; POAG primary open angle glaucoma; C/D cup-to-disc ratio; HVF humphrey visual field; GVF goldmann visual field; OCT optical coherence tomography; IOP intraocular pressure; BRVO Branch retinal vein occlusion; CRVO central retinal vein occlusion; CRAO central retinal artery occlusion; BRAO branch retinal artery occlusion; RT retinal tear; SB scleral buckle; PPV pars plana vitrectomy; VH Vitreous hemorrhage; PRP panretinal laser photocoagulation; IVK intravitreal kenalog; VMT vitreomacular traction; MH Macular hole;  NVD neovascularization of the disc; NVE neovascularization elsewhere; AREDS age related eye disease study; ARMD age related macular degeneration; POAG primary open angle glaucoma; EBMD epithelial/anterior basement membrane dystrophy; ACIOL anterior chamber intraocular lens; IOL intraocular lens; PCIOL posterior chamber intraocular lens; Phaco/IOL phacoemulsification with intraocular lens placement; [place] photorefractive keratectomy; LASIK laser assisted in situ keratomileusis; HTN hypertension; DM diabetes mellitus; COPD chronic obstructive pulmonary disease

## 2020-10-28 ENCOUNTER — Other Ambulatory Visit: Payer: Self-pay

## 2020-10-28 ENCOUNTER — Encounter (INDEPENDENT_AMBULATORY_CARE_PROVIDER_SITE_OTHER): Payer: Self-pay | Admitting: Ophthalmology

## 2020-10-28 ENCOUNTER — Ambulatory Visit (INDEPENDENT_AMBULATORY_CARE_PROVIDER_SITE_OTHER): Payer: BC Managed Care – PPO | Admitting: Ophthalmology

## 2020-10-28 DIAGNOSIS — H25813 Combined forms of age-related cataract, bilateral: Secondary | ICD-10-CM

## 2020-10-28 DIAGNOSIS — H3581 Retinal edema: Secondary | ICD-10-CM | POA: Diagnosis not present

## 2020-10-28 DIAGNOSIS — E113513 Type 2 diabetes mellitus with proliferative diabetic retinopathy with macular edema, bilateral: Secondary | ICD-10-CM | POA: Diagnosis not present

## 2020-10-28 DIAGNOSIS — H35033 Hypertensive retinopathy, bilateral: Secondary | ICD-10-CM

## 2020-10-28 DIAGNOSIS — I10 Essential (primary) hypertension: Secondary | ICD-10-CM | POA: Diagnosis not present

## 2020-11-06 NOTE — Progress Notes (Signed)
[facility] Note  11/11/2020     CHIEF COMPLAINT Patient presents for Retina Follow Up   HISTORY OF PRESENT ILLNESS: Jason Bowen is a 60 y.o. male who presents to the clinic today for:   HPI     Retina Follow Up   Patient presents with  Diabetic Retinopathy.  In both eyes.  Duration of 2 weeks.  Since onset it is stable.  I, the attending physician,  performed the HPI with the patient and updated documentation appropriately.        Comments   2 week follow up PDR OU- Vision appears stable OU. BS 148 this am A1C 8.5 yesterday      Last edited by [name], MD on 11/11/2020 12:23 PM.       Referring physician: No referring provider defined for this encounter.  HISTORICAL INFORMATION:   Selected notes from the MEDICAL RECORD NUMBER Referred by [name] for eval of DR w/DME OS   CURRENT MEDICATIONS: No current outpatient medications on file. (Ophthalmic Drugs)   No current facility-administered medications for this visit. (Ophthalmic Drugs)   Current Outpatient Medications (Other)  Medication Sig   dapagliflozin propanediol (FARXIGA) 10 MG TABS tablet Take 10 mg by mouth daily.   Insulin Degludec (TRESIBA) 100 UNIT/ML SOLN Inject 36 Units into the skin daily.   insulin lispro (HUMALOG) 100 UNIT/ML injection Inject 15 Units into the skin 3 (three) times daily before meals.   lisinopril (ZESTRIL) 20 MG tablet Take 20 mg by mouth daily.   metFORMIN (GLUCOPHAGE) 500 MG tablet Take 500 mg by mouth 2 (two) times daily with a meal.   No current facility-administered medications for this visit. (Other)      REVIEW OF SYSTEMS: ROS   Positive for: Endocrine, Eyes Negative for: Constitutional, Gastrointestinal, Neurological, Skin, Genitourinary, Musculoskeletal, HENT, Cardiovascular, Respiratory, Psychiatric, Allergic/Imm, Heme/Lymph Last edited by [name], COA on 11/11/2020  9:22 AM.          ALLERGIES No Known  Allergies  PAST MEDICAL HISTORY Past Medical History:  Diagnosis Date   Cancer ([place])    Hypertension    Past Surgical History:  Procedure Laterality Date   ACHILLES TENDON SURGERY     ANKLE SURGERY     CARPAL TUNNEL WITH CUBITAL TUNNEL     MOHS SURGERY     has had 20+ surgeries for this on different areas of body    FAMILY HISTORY Family History  Problem Relation Age of Onset   Hypertension Mother    Diabetes Mother    Cataracts Mother    Cancer Paternal Grandmother     SOCIAL HISTORY Social History   Tobacco Use   Smoking status: Never   Smokeless tobacco: Never  Substance Use Topics   Alcohol use: Not Currently         OPHTHALMIC EXAM:  Base Eye Exam     Visual Acuity (Snellen - Linear)       Right Left   Dist cc 20/25 20/50 -2   Dist ph cc NI 20/40 -2    Correction: Glasses         Tonometry (Tonopen, 9:28 AM)       Right Left   Pressure 16 16         Pupils       Dark Light Shape React APD   Right 3 2 Round Brisk None   Left 3 2 Round Brisk None  Visual Fields       Left Right    Full Full         Extraocular Movement       Right Left    Full Full         Neuro/Psych     Oriented x3: Yes   Mood/Affect: Normal         Dilation     Both eyes: 1.0% Mydriacyl, 2.5% Phenylephrine @ 9:28 AM           Slit Lamp and Fundus Exam     Slit Lamp Exam       Right Left   Lids/Lashes Dermatochalasis - upper lid Dermatochalasis - upper lid   Conjunctiva/Sclera White and quiet White and quiet   Cornea arcus arcus   Anterior Chamber deep, clear, narrow temporal angle deep, clear, narrow temporal angle   Iris Round and dilated, No NVI Round and dilated, No NVI   Lens Clear Trace Posterior subcapsular cataract   Vitreous mild syneresis mild syneresis         Fundus Exam       Right Left   Disc Pink and Sharp mild Pallor, Sharp rim   C/D Ratio 0.2 0.2   Macula Flat, blunted foveal reflex, +edema;  scattered MA/DBH, boat shaped subhyalod heme IT to fovea, circinate exudates temporal macula Blunted foveal reflex, circinate exudates and edema ST macula, scattered MA/DBH   Vessels attenuated, mild tortuousity attenuated, mild tortuousity, scattered, early NV greatest superior to disc   Periphery Attached, 360 DBH, patches of NVE, good 360 PRP, room for fill in temporally and posterirly if needed Attached, 360 DBH, patches of NVE regressing            Refraction     Wearing Rx       Sphere Cylinder Add   Right +1.75 Sphere +2.50   Left +1.75 Sphere +2.50            IMAGING AND PROCEDURES  Imaging and Procedures for 11/11/2020  OCT, Retina - OU - Both Eyes       Right Eye Quality was good. Central Foveal Thickness: 291. Progression has been stable. Findings include abnormal foveal contour, intraretinal fluid, intraretinal hyper-reflective material, no SRF (Subhyaloid heme inferior macula, persistent cystic changes / edema temporal macula -- both slightly improved).   Left Eye Quality was good. Central Foveal Thickness: 321. Progression has worsened. Findings include abnormal foveal contour, intraretinal fluid, intraretinal hyper-reflective material, no SRF, vitreomacular adhesion (Mild interval increase in IRF).   Notes *Images captured and stored on drive  Diagnosis / Impression:  +DME OU OD: Subhyaloid heme inferior macula, persistent cystic changes / edema temporal macula -- both slightly improved OS: Mild interval increase in IRF  Clinical management:  See below  Abbreviations: NFP - Normal foveal profile. CME - cystoid macular edema. PED - pigment epithelial detachment. IRF - intraretinal fluid. SRF - subretinal fluid. EZ - ellipsoid zone. ERM - epiretinal membrane. ORA - outer retinal atrophy. ORT - outer retinal tubulation. SRHM - subretinal hyper-reflective material. IRHM - intraretinal hyper-reflective material      Intravitreal Injection, Pharmacologic  Agent - OD - Right Eye       Time Out 11/11/2020. 10:27 AM. Confirmed correct patient, procedure, site, and patient consented.   Anesthesia Topical anesthesia was used. Anesthetic medications included Lidocaine 2%, Proparacaine 0.5%.   Procedure A supplied needle was used.   Injection: 1.25 mg Bevacizumab 1.'25mg'$ /0.57m   Route: Intravitreal, Site: Right  Eye   NDCPM:[id number], Lot: [id number]'$ , Expiration date: 12/21/2020, Waste: 0 mL   Post-op Post injection exam found visual acuity of at least counting fingers. The patient tolerated the procedure well. There were no complications. The patient received written and verbal post procedure care education.      Intravitreal Injection, Pharmacologic Agent - OS - Left Eye       Time Out 11/11/2020. 10:27 AM. Confirmed correct patient, procedure, site, and patient consented.   Anesthesia Topical anesthesia was used. Anesthetic medications included Lidocaine 2%, Proparacaine 0.5%.   Procedure A supplied needle was used.   Injection: 1.25 mg Bevacizumab 1.'25mg'$ /0.68m   Route: Intravitreal, Site: Left Eye   NDC: 580m Lot:[id number] Expiration date: 12/15/2020, Waste: 0.05 mL   Post-op Post injection exam found visual acuity of at least counting fingers. The patient tolerated the procedure well. There were no complications. The patient received written and verbal post procedure care education.            ASSESSMENT/PLAN:    ICD-10-CM   1. Proliferative diabetic retinopathy of both eyes with macular edema associated with type 2 diabetes mellitus (HCC)  E10/3/2022Intravitreal Injection, Pharmacologic Agent - OD - Right Eye    Intravitreal Injection, Pharmacologic Agent - OS - Left Eye    Bevacizumab (AVASTIN) SOLN 1.25 mg    Bevacizumab (AVASTIN) SOLN 1.25 mg    2. Retinal edema  H35.81 OCT, Retina - OU - Both Eyes    3. Essential hypertension  I10     4. Hypertensive retinopathy of both eyes  H35.033     5.  Combined forms of age-related cataract of both eyes  H25.813       1,2. Proliferative diabetic retinopathy, both eyes  - s/p PRP OD (07.20.22)  - s/p PRP OS (08.03.22)  - history of poor BG control - exam shows scattered ISimsand NVE OU; OD with small subhyaloid heme - FA (07.12.22) shows scattered patches of NVE OU - OCT shows OD: Subhyaloid heme inferior macula, persistent cystic changes / edema temporal macula -- both slightly improved; OS: Mild interval increase in IRF - recommend IVA OU #1 today, 08.17.22 for DME - pt wishes to proceed with injections - RBA of procedure discussed, questions answered - informed consent obtained and signed - see procedure note - f/u in 4 wks -- DFE/OCT, possible injection(s)  3,4. Hypertensive retinopathy OU - discussed importance of tight BP control - monitor  5. Mixed Cataract OU - The symptoms of cataract, surgical options, and treatments and risks were discussed with patient. - discussed diagnosis and progression - not yet visually significant - monitor for now  Ophthalmic Meds Ordered this visit:  Meds ordered this encounter  Medications   Bevacizumab (AVASTIN) SOLN 1.25 mg   Bevacizumab (AVASTIN) SOLN 1.25 mg       Return in about 4 weeks (around 12/09/2020) for f/u PDR OU, DFE, OCT.  There are no Patient Instructions on file for this visit.   Explained the diagnoses, plan, and follow up with the patient and they expressed understanding.  Patient expressed understanding of the importance of proper follow up care.   This document serves as a record of services personally performed by B9/27/2022 MD, PhD. It was created on their behalf by DGardiner Sleeper COMT. The creation of this record is the provider's dictation and/or activities during the visit.  Electronically signed by: DRoselee Nova COMT 11/11/20 12:28 PM  This document serves as a record of  services personally performed by Gardiner Sleeper, MD, PhD. It was created on  their behalf by San Jetty. Owens Shark, OA an ophthalmic technician. The creation of this record is the provider's dictation and/or activities during the visit.    Electronically signed by: San Jetty. Marguerita Merles 08.17.2022 12:28 PM   Gardiner Sleeper, M.D., Ph.D. Diseases & Surgery of the Retina and Vitreous [facility]  I have reviewed the above documentation for accuracy and completeness, and I agree with the above. Gardiner Sleeper, M.D., Ph.D. 11/11/20 12:28 PM   Abbreviations: M myopia (nearsighted); A astigmatism; H hyperopia (farsighted); P presbyopia; Mrx spectacle prescription;  CTL contact lenses; OD right eye; OS left eye; OU both eyes  XT exotropia; ET esotropia; PEK punctate epithelial keratitis; PEE punctate epithelial erosions; DES dry eye syndrome; MGD meibomian gland dysfunction; ATs artificial tears; PFAT's preservative free artificial tears; [place] nuclear sclerotic cataract; PSC posterior subcapsular cataract; ERM epi-retinal membrane; PVD posterior vitreous detachment; RD retinal detachment; DM diabetes mellitus; DR diabetic retinopathy; NPDR non-proliferative diabetic retinopathy; PDR proliferative diabetic retinopathy; CSME clinically significant macular edema; DME diabetic macular edema; dbh dot blot hemorrhages; CWS cotton wool spot; POAG primary open angle glaucoma; C/D cup-to-disc ratio; HVF humphrey visual field; GVF goldmann visual field; OCT optical coherence tomography; IOP intraocular pressure; BRVO Branch retinal vein occlusion; CRVO central retinal vein occlusion; CRAO central retinal artery occlusion; BRAO branch retinal artery occlusion; RT retinal tear; SB scleral buckle; PPV pars plana vitrectomy; VH Vitreous hemorrhage; PRP panretinal laser photocoagulation; IVK intravitreal kenalog; VMT vitreomacular traction; MH Macular hole;  NVD neovascularization of the disc; NVE neovascularization elsewhere; AREDS age related eye disease study; ARMD age related  macular degeneration; POAG primary open angle glaucoma; EBMD epithelial/anterior basement membrane dystrophy; ACIOL anterior chamber intraocular lens; IOL intraocular lens; PCIOL posterior chamber intraocular lens; Phaco/IOL phacoemulsification with intraocular lens placement; [place] photorefractive keratectomy; LASIK laser assisted in situ keratomileusis; HTN hypertension; DM diabetes mellitus; COPD chronic obstructive pulmonary disease

## 2020-11-10 DIAGNOSIS — E1169 Type 2 diabetes mellitus with other specified complication: Secondary | ICD-10-CM | POA: Diagnosis not present

## 2020-11-10 DIAGNOSIS — E1141 Type 2 diabetes mellitus with diabetic mononeuropathy: Secondary | ICD-10-CM | POA: Diagnosis not present

## 2020-11-10 DIAGNOSIS — Z794 Long term (current) use of insulin: Secondary | ICD-10-CM | POA: Diagnosis not present

## 2020-11-10 DIAGNOSIS — E1165 Type 2 diabetes mellitus with hyperglycemia: Secondary | ICD-10-CM | POA: Diagnosis not present

## 2020-11-10 DIAGNOSIS — Z6833 Body mass index (BMI) 33.0-33.9, adult: Secondary | ICD-10-CM | POA: Diagnosis not present

## 2020-11-11 ENCOUNTER — Encounter (INDEPENDENT_AMBULATORY_CARE_PROVIDER_SITE_OTHER): Payer: Self-pay | Admitting: Ophthalmology

## 2020-11-11 ENCOUNTER — Ambulatory Visit (INDEPENDENT_AMBULATORY_CARE_PROVIDER_SITE_OTHER): Payer: BC Managed Care – PPO | Admitting: Ophthalmology

## 2020-11-11 ENCOUNTER — Other Ambulatory Visit: Payer: Self-pay

## 2020-11-11 DIAGNOSIS — E113513 Type 2 diabetes mellitus with proliferative diabetic retinopathy with macular edema, bilateral: Secondary | ICD-10-CM | POA: Diagnosis not present

## 2020-11-11 DIAGNOSIS — I10 Essential (primary) hypertension: Secondary | ICD-10-CM

## 2020-11-11 DIAGNOSIS — H3581 Retinal edema: Secondary | ICD-10-CM

## 2020-11-11 DIAGNOSIS — H35033 Hypertensive retinopathy, bilateral: Secondary | ICD-10-CM | POA: Diagnosis not present

## 2020-11-11 DIAGNOSIS — H25813 Combined forms of age-related cataract, bilateral: Secondary | ICD-10-CM

## 2020-11-11 MED ORDER — BEVACIZUMAB CHEMO INJECTION 1.25MG/0.05ML SYRINGE FOR KALEIDOSCOPE
1.2500 mg | INTRAVITREAL | Status: AC | PRN
  Administered 2020-11-11: 1.25 mg via INTRAVITREAL

## 2020-12-03 NOTE — Progress Notes (Signed)
[facility] Note  12/09/2020     CHIEF COMPLAINT Patient presents for Retina Follow Up   HISTORY OF PRESENT ILLNESS: Jason Bowen is a 60 y.o. male who presents to the clinic today for:   HPI     Retina Follow Up   Patient presents with  Diabetic Retinopathy.  In both eyes.  This started months ago.  Severity is moderate.  Duration of 4 weeks.  Since onset it is gradually improving.  I, the attending physician,  performed the HPI with the patient and updated documentation appropriately.        Comments   60 y/o male pt here for 4 wk f/u for PDR OU.  Feels VA OS is gradually improving.  No change in [place] OD.  Denies pain, FOL, floaters.  No gtts.  BS 172 this a.m,.  A1C 8.5 4 wks ago.      Last edited by [name], MD on 12/11/2020  4:06 PM.    Pt feels VA is slightly improved OU.  Pt had a bad headache recently due to eye strain at near.  Referring physician: No referring provider defined for this encounter.  HISTORICAL INFORMATION:   Selected notes from the MEDICAL RECORD NUMBER Referred by [name] for eval of DR w/DME OS   CURRENT MEDICATIONS: No current outpatient medications on file. (Ophthalmic Drugs)   No current facility-administered medications for this visit. (Ophthalmic Drugs)   Current Outpatient Medications (Other)  Medication Sig   dapagliflozin propanediol (FARXIGA) 10 MG TABS tablet Take 10 mg by mouth daily.   Insulin Degludec (TRESIBA) 100 UNIT/ML SOLN Inject 36 Units into the skin daily.   insulin lispro (HUMALOG) 100 UNIT/ML injection Inject 15 Units into the skin 3 (three) times daily before meals.   lisinopril (ZESTRIL) 20 MG tablet Take 20 mg by mouth daily.   metFORMIN (GLUCOPHAGE) 500 MG tablet Take 500 mg by mouth 2 (two) times daily with a meal.   triamcinolone cream (KENALOG) 0.1 % SMARTSIG:1 Application Topical 2-3 Times Daily   lisinopril (ZESTRIL) 20 MG tablet 1 tablet (Patient not taking: Reported on  12/09/2020)   metFORMIN (GLUCOPHAGE-XR) 500 MG 24 hr tablet 1 tablet with evening meal (Patient not taking: Reported on 12/09/2020)   No current facility-administered medications for this visit. (Other)   REVIEW OF SYSTEMS: ROS   Positive for: Endocrine, Eyes Negative for: Constitutional, Gastrointestinal, Neurological, Skin, Genitourinary, Musculoskeletal, HENT, Cardiovascular, Respiratory, Psychiatric, Allergic/Imm, Heme/Lymph Last edited by Matthew Folks, COA on 12/09/2020  9:19 AM.     ALLERGIES No Known Allergies  PAST MEDICAL HISTORY Past Medical History:  Diagnosis Date   Cancer ([place])    Cataract    Diabetic retinopathy ([place])    Hypertension    Hypertensive retinopathy    Past Surgical History:  Procedure Laterality Date   ACHILLES TENDON SURGERY     ANKLE SURGERY     CARPAL TUNNEL WITH CUBITAL TUNNEL     MOHS SURGERY     has had 20+ surgeries for this on different areas of body   FAMILY HISTORY Family History  Problem Relation Age of Onset   Hypertension Mother    Diabetes Mother    Cataracts Mother    Cancer Paternal Grandmother     SOCIAL HISTORY Social History   Tobacco Use   Smoking status: Never   Smokeless tobacco: Never  Substance Use Topics   Alcohol use: Not Currently  OPHTHALMIC EXAM:  Base Eye Exam     Visual Acuity (Snellen - Linear)       Right Left   Dist [place] 20/25 20/60 -2   Dist ph [place]  20/40 +2         Tonometry (Tonopen, 9:30 AM)       Right Left   Pressure 16 17         Pupils       Dark Light Shape React APD   Right 3 2 Round Brisk None   Left 3 2 Round Brisk None         Visual Fields (Counting fingers)       Left Right    Full Full         Extraocular Movement       Right Left    Full, Ortho Full, Ortho         Neuro/Psych     Oriented x3: Yes   Mood/Affect: Normal         Dilation     Both eyes: 1.0% Mydriacyl, 2.5% Phenylephrine @ 9:30 AM           Slit Lamp and  Fundus Exam     Slit Lamp Exam       Right Left   Lids/Lashes Dermatochalasis - upper lid Dermatochalasis - upper lid   Conjunctiva/Sclera White and quiet White and quiet   Cornea arcus arcus   Anterior Chamber deep, clear, narrow temporal angle deep, clear, narrow temporal angle   Iris Round and dilated, No NVI Round and dilated, No NVI   Lens Clear Trace Posterior subcapsular cataract   Vitreous mild syneresis mild syneresis         Fundus Exam       Right Left   Disc Pink and Sharp mild Pallor, Sharp rim   C/D Ratio 0.2 0.2   Macula Flat, blunted foveal reflex, +edema; scattered MA/DBH, boat shaped subhyalod heme IT to fovea--clearing, circinate exudates temporal macula Blunted foveal reflex, circinate exudates and edema ST macula--improving, scattered MA/DBH   Vessels attenuated, mild tortuousity attenuated, mild tortuousity, scattered, early NV greatest superior to disc   Periphery Attached, 360 DBH, patches of NVE, good 360 PRP, room for fill in temporally and posteriorly if needed Attached, 360 DBH, patches of NVE regressing, good 360 PRP            IMAGING AND PROCEDURES  Imaging and Procedures for 12/09/2020  OCT, Retina - OU - Both Eyes       Right Eye Quality was good. Central Foveal Thickness: 273. Progression has improved. Findings include abnormal foveal contour, intraretinal fluid, intraretinal hyper-reflective material, no SRF (Mild interval improvement in IRF and IRHM temporal mac. Interval increase in pre-retinal/subhyaloid heme IT mac.).   Left Eye Quality was good. Central Foveal Thickness: 257. Progression has improved. Findings include abnormal foveal contour, intraretinal fluid, intraretinal hyper-reflective material, no SRF, vitreomacular adhesion (Interval improvement in IRF/IRHM temporal mac).   Notes *Images captured and stored on drive  Diagnosis / Impression:  +DME OU OD: Mild interval improvement in IRF and IRHM temporal mac.  Interval  increase in pre-retinal/subhyaloid heme IT mac OS: Interval improvement in IRF/IRHM temporal mac  Clinical management:  See below  Abbreviations: NFP - Normal foveal profile. CME - cystoid macular edema. PED - pigment epithelial detachment. IRF - intraretinal fluid. SRF - subretinal fluid. EZ - ellipsoid zone. ERM - epiretinal membrane. ORA - outer retinal atrophy. ORT - outer retinal  tubulation. SRHM - subretinal hyper-reflective material. IRHM - intraretinal hyper-reflective material      Intravitreal Injection, Pharmacologic Agent - OD - Right Eye       Time Out 12/09/2020. 10:34 AM. Confirmed correct patient, procedure, site, and patient consented.   Anesthesia Topical anesthesia was used. Anesthetic medications included Lidocaine 2%, Proparacaine 0.5%.   Procedure Preparation included 5% betadine to ocular surface, eyelid speculum. A supplied (32g) needle was used.   Injection: 1.25 mg Bevacizumab 1.51m/0.05ml   Route: Intravitreal, Site: Right Eye   NDC: 50242-060-01, Lot: [id number], Expiration date: 01/17/2021, Waste: 0.05 mL   Post-op Post injection exam found visual acuity of at least counting fingers. The patient tolerated the procedure well. There were no complications. The patient received written and verbal post procedure care education. Post injection medications were not given.      Intravitreal Injection, Pharmacologic Agent - OS - Left Eye       Time Out 12/09/2020. 10:34 AM. Confirmed correct patient, procedure, site, and patient consented.   Anesthesia Topical anesthesia was used. Anesthetic medications included Lidocaine 2%, Proparacaine 0.5%.   Procedure Preparation included 5% betadine to ocular surface, eyelid speculum. A supplied (32g) needle was used.   Injection: 1.25 mg Bevacizumab 1.259m0.05ml   Route: Intravitreal, Site: Left Eye   NDC: : 80998-338-25Lot: : [id number] date: 02/17/2021, Waste: 0.05 mL   Post-op Post injection exam  found visual acuity of at least counting fingers. The patient tolerated the procedure well. There were no complications. The patient received written and verbal post procedure care education. Post injection medications were not given.             ASSESSMENT/PLAN:    ICD-10-CM   1. Proliferative diabetic retinopathy of both eyes with macular edema associated with type 2 diabetes mellitus (HCC)  E1H41.9379ntravitreal Injection, Pharmacologic Agent - OD - Right Eye    Intravitreal Injection, Pharmacologic Agent - OS - Left Eye    Bevacizumab (AVASTIN) SOLN 1.25 mg    Bevacizumab (AVASTIN) SOLN 1.25 mg    2. Retinal edema  H35.81 OCT, Retina - OU - Both Eyes    3. Essential hypertension  I10     4. Hypertensive retinopathy of both eyes  H35.033     5. Combined forms of age-related cataract of both eyes  H25.813     1,2. Proliferative diabetic retinopathy, both eyes  - s/p PRP OD (07.20.22)  - s/p PRP OS (08.03.22)  -s/p IVA OU #1 (08.17.22)  - history of poor BG control - exam shows scattered IRH and NVE OU; OD with small subhyaloid heme - FA (07.12.22) shows scattered patches of NVE OU - OCT shows OD: Mild interval improvement in IRF and IRHM temporal mac.  Interval increase in pre-retinal/subhyaloid heme IT mac; OS: Interval improvement in IRF/IRHM temporal mac - recommend IVA OU #2 today, 09.14.22 for DME - pt wishes to proceed with injections - RBA of procedure discussed, questions answered - informed consent obtained and signed - see procedure note - f/u in 4 wks -- DFE/OCT, possible injection(s)  3,4. Hypertensive retinopathy OU - discussed importance of tight BP control - monitor  5. Mixed Cataract OU - The symptoms of cataract, surgical options, and treatments and risks were discussed with patient. - discussed diagnosis and progression - not yet visually significant - monitor for now  Ophthalmic Meds Ordered this visit:  Meds ordered this encounter  Medications    Bevacizumab (AVASTIN) SOLN 1.25 mg   Bevacizumab (AVASTIN) SOLN  1.25 mg       Return in about 4 weeks (around 01/06/2021) for 4 wk f/u for PDR OU w/DFE/OCT/likely inj. OU.  There are no Patient Instructions on file for this visit.  This document serves as a record of services personally performed by Gardiner Sleeper, MD, PhD. It was created on their behalf by Orvan Falconer, an ophthalmic technician. The creation of this record is the provider's dictation and/or activities during the visit.    Electronically signed by: Orvan Falconer, OA, 12/11/20  4:10 PM   This document serves as a record of services personally performed by Gardiner Sleeper, MD, PhD. It was created on their behalf by Estill Bakes, COT an ophthalmic technician. The creation of this record is the provider's dictation and/or activities during the visit.    Electronically signed by: Estill Bakes, COT 9.14.22 @ 4:10 PM   Gardiner Sleeper, M.D., Ph.D. Diseases & Surgery of the Retina and [place] 12/09/2020   I have reviewed the above documentation for accuracy and completeness, and I agree with the above. Gardiner Sleeper, M.D., Ph.D. 12/11/20 4:10 PM  Abbreviations: M myopia (nearsighted); A astigmatism; H hyperopia (farsighted); P presbyopia; Mrx spectacle prescription;  CTL contact lenses; OD right eye; OS left eye; OU both eyes  XT exotropia; ET esotropia; PEK punctate epithelial keratitis; PEE punctate epithelial erosions; DES dry eye syndrome; MGD meibomian gland dysfunction; ATs artificial tears; PFAT's preservative free artificial tears; [place] nuclear sclerotic cataract; PSC posterior subcapsular cataract; ERM epi-retinal membrane; PVD posterior vitreous detachment; RD retinal detachment; DM diabetes mellitus; DR diabetic retinopathy; NPDR non-proliferative diabetic retinopathy; PDR proliferative diabetic retinopathy; CSME clinically significant macular edema; DME diabetic macular  edema; dbh dot blot hemorrhages; CWS cotton wool spot; POAG primary open angle glaucoma; C/D cup-to-disc ratio; HVF humphrey visual field; GVF goldmann visual field; OCT optical coherence tomography; IOP intraocular pressure; BRVO Branch retinal vein occlusion; CRVO central retinal vein occlusion; CRAO central retinal artery occlusion; BRAO branch retinal artery occlusion; RT retinal tear; SB scleral buckle; PPV pars plana vitrectomy; VH Vitreous hemorrhage; PRP panretinal laser photocoagulation; IVK intravitreal kenalog; VMT vitreomacular traction; MH Macular hole;  NVD neovascularization of the disc; NVE neovascularization elsewhere; AREDS age related eye disease study; ARMD age related macular degeneration; POAG primary open angle glaucoma; EBMD epithelial/anterior basement membrane dystrophy; ACIOL anterior chamber intraocular lens; IOL intraocular lens; PCIOL posterior chamber intraocular lens; Phaco/IOL phacoemulsification with intraocular lens placement; [place] photorefractive keratectomy; LASIK laser assisted in situ keratomileusis; HTN hypertension; DM diabetes mellitus; COPD chronic obstructive pulmonary disease

## 2020-12-09 ENCOUNTER — Ambulatory Visit (INDEPENDENT_AMBULATORY_CARE_PROVIDER_SITE_OTHER): Payer: BC Managed Care – PPO | Admitting: Ophthalmology

## 2020-12-09 ENCOUNTER — Other Ambulatory Visit: Payer: Self-pay

## 2020-12-09 ENCOUNTER — Encounter (INDEPENDENT_AMBULATORY_CARE_PROVIDER_SITE_OTHER): Payer: Self-pay | Admitting: Ophthalmology

## 2020-12-09 DIAGNOSIS — I10 Essential (primary) hypertension: Secondary | ICD-10-CM

## 2020-12-09 DIAGNOSIS — H3581 Retinal edema: Secondary | ICD-10-CM

## 2020-12-09 DIAGNOSIS — H25813 Combined forms of age-related cataract, bilateral: Secondary | ICD-10-CM | POA: Diagnosis not present

## 2020-12-09 DIAGNOSIS — E113513 Type 2 diabetes mellitus with proliferative diabetic retinopathy with macular edema, bilateral: Secondary | ICD-10-CM

## 2020-12-09 DIAGNOSIS — H35033 Hypertensive retinopathy, bilateral: Secondary | ICD-10-CM | POA: Diagnosis not present

## 2020-12-11 ENCOUNTER — Encounter (INDEPENDENT_AMBULATORY_CARE_PROVIDER_SITE_OTHER): Payer: Self-pay | Admitting: Ophthalmology

## 2020-12-11 MED ORDER — BEVACIZUMAB CHEMO INJECTION 1.25MG/0.05ML SYRINGE FOR KALEIDOSCOPE
1.2500 mg | INTRAVITREAL | Status: AC | PRN
  Administered 2020-12-09: 1.25 mg via INTRAVITREAL

## 2020-12-14 DIAGNOSIS — L570 Actinic keratosis: Secondary | ICD-10-CM | POA: Diagnosis not present

## 2020-12-14 DIAGNOSIS — C44619 Basal cell carcinoma of skin of left upper limb, including shoulder: Secondary | ICD-10-CM | POA: Diagnosis not present

## 2020-12-14 DIAGNOSIS — D225 Melanocytic nevi of trunk: Secondary | ICD-10-CM | POA: Diagnosis not present

## 2020-12-14 DIAGNOSIS — C4441 Basal cell carcinoma of skin of scalp and neck: Secondary | ICD-10-CM | POA: Diagnosis not present

## 2020-12-14 DIAGNOSIS — C44712 Basal cell carcinoma of skin of right lower limb, including hip: Secondary | ICD-10-CM | POA: Diagnosis not present

## 2020-12-14 DIAGNOSIS — D485 Neoplasm of uncertain behavior of skin: Secondary | ICD-10-CM | POA: Diagnosis not present

## 2020-12-14 DIAGNOSIS — C44319 Basal cell carcinoma of skin of other parts of face: Secondary | ICD-10-CM | POA: Diagnosis not present

## 2021-01-01 NOTE — Progress Notes (Signed)
[facility] Note  01/06/2021     CHIEF COMPLAINT Patient presents for Retina Follow Up   HISTORY OF PRESENT ILLNESS: Jason Bowen is a 60 y.o. male who presents to the clinic today for:   HPI     Retina Follow Up   Patient presents with  Diabetic Retinopathy.  In both eyes.  This started weeks ago.  Severity is moderate.  Duration of weeks.  Since onset it is stable.  I, the attending physician,  performed the HPI with the patient and updated documentation appropriately.        Comments   Pt states vision is the same OU.  Pt denies eye pain or discomfort.  Pt denies new or worsening floaters or fol OU.      Last edited by [name], MD on 01/06/2021 12:49 PM.    Pt feels like his vision is doing well overall, especially noticing improvement in his left eye.  Referring physician: No referring provider defined for this encounter.  HISTORICAL INFORMATION:   Selected notes from the MEDICAL RECORD NUMBER Referred by [name] for eval of DR w/DME OS   CURRENT MEDICATIONS: No current outpatient medications on file. (Ophthalmic Drugs)   No current facility-administered medications for this visit. (Ophthalmic Drugs)   Current Outpatient Medications (Other)  Medication Sig   dapagliflozin propanediol (FARXIGA) 10 MG TABS tablet Take 10 mg by mouth daily.   Insulin Degludec (TRESIBA) 100 UNIT/ML SOLN Inject 36 Units into the skin daily.   insulin lispro (HUMALOG) 100 UNIT/ML injection Inject 15 Units into the skin 3 (three) times daily before meals.   lisinopril (ZESTRIL) 20 MG tablet Take 20 mg by mouth daily.   lisinopril (ZESTRIL) 20 MG tablet 1 tablet (Patient not taking: Reported on 12/09/2020)   metFORMIN (GLUCOPHAGE) 500 MG tablet Take 500 mg by mouth 2 (two) times daily with a meal.   metFORMIN (GLUCOPHAGE-XR) 500 MG 24 hr tablet 1 tablet with evening meal (Patient not taking: Reported on 12/09/2020)   triamcinolone cream (KENALOG) 0.1  % SMARTSIG:1 Application Topical 2-3 Times Daily   No current facility-administered medications for this visit. (Other)   REVIEW OF SYSTEMS: ROS   Positive for: Endocrine, Eyes Negative for: Constitutional, Gastrointestinal, Neurological, Skin, Genitourinary, Musculoskeletal, HENT, Cardiovascular, Respiratory, Psychiatric, Allergic/Imm, Heme/Lymph Last edited by Doneen Poisson on 01/06/2021  9:02 AM.    ALLERGIES No Known Allergies  PAST MEDICAL HISTORY Past Medical History:  Diagnosis Date   Cancer ([place])    Cataract    Diabetic retinopathy ([place])    Hypertension    Hypertensive retinopathy    Past Surgical History:  Procedure Laterality Date   ACHILLES TENDON SURGERY     ANKLE SURGERY     CARPAL TUNNEL WITH CUBITAL TUNNEL     MOHS SURGERY     has had 20+ surgeries for this on different areas of body   FAMILY HISTORY Family History  Problem Relation Age of Onset   Hypertension Mother    Diabetes Mother    Cataracts Mother    Cancer Paternal Grandmother     SOCIAL HISTORY Social History   Tobacco Use   Smoking status: Never   Smokeless tobacco: Never  Substance Use Topics   Alcohol use: Not Currently       OPHTHALMIC EXAM: Base Eye Exam     Visual Acuity (Snellen - Linear)       Right Left   Dist cc 20/30 -1 20/50 -  2   Dist ph cc 20/30 +1 20/30 -2    Correction: Glasses         Tonometry (Tonopen, 9:07 AM)       Right Left   Pressure 18 18         Pupils       Dark Light Shape React APD   Right 3 2 Round Brisk 0   Left 3 2 Round Brisk 0         Visual Fields       Left Right    Full Full         Extraocular Movement       Right Left    Full Full         Neuro/Psych     Oriented x3: Yes   Mood/Affect: Normal         Dilation     Both eyes: 1.0% Mydriacyl, 2.5% Phenylephrine @ 9:07 AM           Slit Lamp and Fundus Exam     Slit Lamp Exam       Right Left   Lids/Lashes Dermatochalasis - upper lid  Dermatochalasis - upper lid   Conjunctiva/Sclera White and quiet White and quiet   Cornea arcus arcus   Anterior Chamber deep, clear, narrow temporal angle deep, clear, narrow temporal angle   Iris Round and dilated, No NVI Round and dilated, No NVI   Lens Clear Trace Posterior subcapsular cataract   Vitreous mild syneresis mild syneresis         Fundus Exam       Right Left   Disc Pink and Sharp mild Pallor, Sharp rim   C/D Ratio 0.2 0.2   Macula Flat, blunted foveal reflex, +edema; scattered MA/DBH, boat shaped subhyalod heme IT to fovea--turning gray, circinate exudates temporal macula Blunted foveal reflex, circinate exudates and edema ST macula--improving, scattered MA/DBH   Vessels attenuated, mild tortuousity attenuated, mild tortuousity, scattered, early NV greatest superior to disc   Periphery Attached, 360 DBH, patches of NVE, good 360 PRP, room for fill in temporally and posteriorly if needed Attached, 360 DBH, patches of NVE regressing, good 360 PRP           Refraction     Wearing Rx       Sphere Cylinder Add   Right +1.75 Sphere +2.50   Left +1.75 Sphere +2.50           IMAGING AND PROCEDURES  Imaging and Procedures for 01/06/2021  OCT, Retina - OU - Both Eyes       Right Eye Quality was good. Central Foveal Thickness: 271. Progression has improved. Findings include abnormal foveal contour, intraretinal fluid, intraretinal hyper-reflective material, no SRF (Mild interval improvement in IRF and IRHM temporal mac. Interval improvement in pre-retinal/subhyaloid heme IT mac.).   Left Eye Quality was good. Central Foveal Thickness: 253. Progression has improved. Findings include abnormal foveal contour, intraretinal fluid, intraretinal hyper-reflective material, no SRF, vitreomacular adhesion (Interval improvement in IRF/IRHM temporal mac).   Notes *Images captured and stored on drive  Diagnosis / Impression:  +DME OU OD: Mild interval improvement in  IRF and IRHM temporal mac.  Interval improvement in pre-retinal/subhyaloid heme IT mac. OS: Interval improvement in IRF/IRHM temporal mac  Clinical management:  See below  Abbreviations: NFP - Normal foveal profile. CME - cystoid macular edema. PED - pigment epithelial detachment. IRF - intraretinal fluid. SRF - subretinal fluid. EZ - ellipsoid zone. ERM -  epiretinal membrane. ORA - outer retinal atrophy. ORT - outer retinal tubulation. SRHM - subretinal hyper-reflective material. IRHM - intraretinal hyper-reflective material      Intravitreal Injection, Pharmacologic Agent - OD - Right Eye       Time Out 01/06/2021. 9:48 AM. Confirmed correct patient, procedure, site, and patient consented.   Anesthesia Topical anesthesia was used. Anesthetic medications included Lidocaine 2%, Proparacaine 0.5%.   Procedure Preparation included 5% betadine to ocular surface, eyelid speculum. A supplied (32g) needle was used.   Injection: 1.25 mg Bevacizumab 1.24m/0.05ml   Route: Intravitreal, Site: Right Eye   NDC:: 13244-010-27 Lot: [id number]<\[id number], Expiration date: 02/10/2021, Waste: 0 mL   Post-op Post injection exam found visual acuity of at least counting fingers. The patient tolerated the procedure well. There were no complications. The patient received written and verbal post procedure care education. Post injection medications were not given.      Intravitreal Injection, Pharmacologic Agent - OS - Left Eye       Time Out 01/06/2021. 9:48 AM. Confirmed correct patient, procedure, site, and patient consented.   Anesthesia Topical anesthesia was used. Anesthetic medications included Lidocaine 2%, Proparacaine 0.5%.   Procedure Preparation included 5% betadine to ocular surface, eyelid speculum. A supplied (32g) needle was used.   Injection: 1.25 mg Bevacizumab 1.244m0.05ml   Route: Intravitreal, Site: Left Eye   NDC: : 10932-355-73Lot: : [id number] date: 02/17/2021, Waste:  0.05 mL   Post-op Post injection exam found visual acuity of at least counting fingers. The patient tolerated the procedure well. There were no complications. The patient received written and verbal post procedure care education. Post injection medications were not given.            ASSESSMENT/PLAN:   ICD-10-CM   1. Proliferative diabetic retinopathy of both eyes with macular edema associated with type 2 diabetes mellitus (HCC)  E1H06.2376ntravitreal Injection, Pharmacologic Agent - OD - Right Eye    Intravitreal Injection, Pharmacologic Agent - OS - Left Eye    Bevacizumab (AVASTIN) SOLN 1.25 mg    Bevacizumab (AVASTIN) SOLN 1.25 mg    2. Retinal edema  H35.81 OCT, Retina - OU - Both Eyes    3. Essential hypertension  I10     4. Hypertensive retinopathy of both eyes  H35.033     5. Combined forms of age-related cataract of both eyes  H25.813     1,2. Proliferative diabetic retinopathy, both eyes  - s/p PRP OD (07.20.22)  - s/p PRP OS (08.03.22)  - s/p IVA OU #1 (08.17.22), #2 (09.14.22)  - history of poor BG control - exam shows scattered IRH and NVE OU; OD with small subhyaloid heme -- improving - FA (07.12.22) shows scattered patches of NVE OU - OCT shows OD: Mild interval improvement in IRF and IRHM temporal mac. Interval improvement in pre-retinal/subhyaloid heme IT mac. OS: Interval improvement in IRF/IRHM temporal mac - recommend IVA OU #3 today, 10.12.22 for DME - pt wishes to proceed with injections - RBA of procedure discussed, questions answered - informed consent obtained and signed - see procedure note - f/u in 4 wks -- DFE/OCT, possible injection(s)  3,4. Hypertensive retinopathy OU - discussed importance of tight BP control - monitor  5. Mixed Cataract OU - The symptoms of cataract, surgical options, and treatments and risks were discussed with patient. - discussed diagnosis and progression - not yet visually significant - monitor for now  Ophthalmic  Meds Ordered this visit:  Meds ordered this encounter  Medications   Bevacizumab (AVASTIN) SOLN 1.25 mg   Bevacizumab (AVASTIN) SOLN 1.25 mg     Return in about 4 weeks (around 02/03/2021) for 4 wk f/u for PDR OU w/DFE/OCT/likely inj. OU.  There are no Patient Instructions on file for this visit.  This document serves as a record of services personally performed by Gardiner Sleeper, MD, PhD. It was created on their behalf by Orvan Falconer, an ophthalmic technician. The creation of this record is the provider's dictation and/or activities during the visit.    Electronically signed by: Orvan Falconer, OA, 01/06/21  12:51 PM  This document serves as a record of services personally performed by Gardiner Sleeper, MD, PhD. It was created on their behalf by Estill Bakes, COT an ophthalmic technician. The creation of this record is the provider's dictation and/or activities during the visit.    Electronically signed by: Estill Bakes, COT 10.12.22 @ 12:51 PM   Gardiner Sleeper, M.D., Ph.D. Diseases & Surgery of the Retina and [address] 10.12.22   I have reviewed the above documentation for accuracy and completeness, and I agree with the above. Gardiner Sleeper, M.D., Ph.D. 01/06/21 12:51 PM  Abbreviations: M myopia (nearsighted); A astigmatism; H hyperopia (farsighted); P presbyopia; Mrx spectacle prescription;  CTL contact lenses; OD right eye; OS left eye; OU both eyes  XT exotropia; ET esotropia; PEK punctate epithelial keratitis; PEE punctate epithelial erosions; DES dry eye syndrome; MGD meibomian gland dysfunction; ATs artificial tears; PFAT's preservative free artificial tears; [place] nuclear sclerotic cataract; PSC posterior subcapsular cataract; ERM epi-retinal membrane; PVD posterior vitreous detachment; RD retinal detachment; DM diabetes mellitus; DR diabetic retinopathy; NPDR non-proliferative diabetic retinopathy; PDR proliferative diabetic retinopathy;  CSME clinically significant macular edema; DME diabetic macular edema; dbh dot blot hemorrhages; CWS cotton wool spot; POAG primary open angle glaucoma; C/D cup-to-disc ratio; HVF humphrey visual field; GVF goldmann visual field; OCT optical coherence tomography; IOP intraocular pressure; BRVO Branch retinal vein occlusion; CRVO central retinal vein occlusion; CRAO central retinal artery occlusion; BRAO branch retinal artery occlusion; RT retinal tear; SB scleral buckle; PPV pars plana vitrectomy; VH Vitreous hemorrhage; PRP panretinal laser photocoagulation; IVK intravitreal kenalog; VMT vitreomacular traction; MH Macular hole;  NVD neovascularization of the disc; NVE neovascularization elsewhere; AREDS age related eye disease study; ARMD age related macular degeneration; POAG primary open angle glaucoma; EBMD epithelial/anterior basement membrane dystrophy; ACIOL anterior chamber intraocular lens; IOL intraocular lens; PCIOL posterior chamber intraocular lens; Phaco/IOL phacoemulsification with intraocular lens placement; [place] photorefractive keratectomy; LASIK laser assisted in situ keratomileusis; HTN hypertension; DM diabetes mellitus; COPD chronic obstructive pulmonary disease

## 2021-01-06 ENCOUNTER — Other Ambulatory Visit: Payer: Self-pay

## 2021-01-06 ENCOUNTER — Ambulatory Visit (INDEPENDENT_AMBULATORY_CARE_PROVIDER_SITE_OTHER): Payer: BC Managed Care – PPO | Admitting: Ophthalmology

## 2021-01-06 ENCOUNTER — Encounter (INDEPENDENT_AMBULATORY_CARE_PROVIDER_SITE_OTHER): Payer: Self-pay | Admitting: Ophthalmology

## 2021-01-06 DIAGNOSIS — H25813 Combined forms of age-related cataract, bilateral: Secondary | ICD-10-CM | POA: Diagnosis not present

## 2021-01-06 DIAGNOSIS — H35033 Hypertensive retinopathy, bilateral: Secondary | ICD-10-CM | POA: Diagnosis not present

## 2021-01-06 DIAGNOSIS — E113513 Type 2 diabetes mellitus with proliferative diabetic retinopathy with macular edema, bilateral: Secondary | ICD-10-CM

## 2021-01-06 DIAGNOSIS — I10 Essential (primary) hypertension: Secondary | ICD-10-CM

## 2021-01-06 DIAGNOSIS — H3581 Retinal edema: Secondary | ICD-10-CM

## 2021-01-06 MED ORDER — BEVACIZUMAB CHEMO INJECTION 1.25MG/0.05ML SYRINGE FOR KALEIDOSCOPE
1.2500 mg | INTRAVITREAL | Status: AC | PRN
  Administered 2021-01-06: 1.25 mg via INTRAVITREAL

## 2021-02-01 NOTE — Progress Notes (Signed)
[facility] Note  02/03/2021     CHIEF COMPLAINT Patient presents for Retina Follow Up   HISTORY OF PRESENT ILLNESS: Jason Bowen is a 60 y.o. male who presents to the clinic today for:   HPI     Retina Follow Up   Patient presents with  Diabetic Retinopathy.  In both eyes.  This started 4 weeks ago.  I, the attending physician,  performed the HPI with the patient and updated documentation appropriately.        Comments   Patient here for 4 weeks retina follow up for PDR OU. Patient states vision doing good. No eye pain.       Last edited by [name], MD on 02/03/2021  1:26 PM.     HPI     Retina Follow Up   Patient presents with  Diabetic Retinopathy.  In both eyes.  This started 4 weeks ago.  I, the attending physician,  performed the HPI with the patient and updated documentation appropriately.        Comments   Patient here for 4 weeks retina follow up for PDR OU. Patient states vision doing good. No eye pain.       Last edited by [name], MD on 02/03/2021  1:26 PM.     Referring physician: No referring provider defined for this encounter.  HISTORICAL INFORMATION:   Selected notes from the MEDICAL RECORD NUMBER Referred by [name] for eval of DR w/DME OS   CURRENT MEDICATIONS: No current outpatient medications on file. (Ophthalmic Drugs)   No current facility-administered medications for this visit. (Ophthalmic Drugs)   Current Outpatient Medications (Other)  Medication Sig   dapagliflozin propanediol (FARXIGA) 10 MG TABS tablet Take 10 mg by mouth daily.   Insulin Degludec (TRESIBA) 100 UNIT/ML SOLN Inject 36 Units into the skin daily.   insulin lispro (HUMALOG) 100 UNIT/ML injection Inject 15 Units into the skin 3 (three) times daily before meals.   lisinopril (ZESTRIL) 20 MG tablet Take 20 mg by mouth daily.   lisinopril (ZESTRIL) 20 MG tablet 1 tablet (Patient not taking: Reported on 12/09/2020)    metFORMIN (GLUCOPHAGE) 500 MG tablet Take 500 mg by mouth 2 (two) times daily with a meal.   metFORMIN (GLUCOPHAGE-XR) 500 MG 24 hr tablet 1 tablet with evening meal (Patient not taking: Reported on 12/09/2020)   triamcinolone cream (KENALOG) 0.1 % SMARTSIG:1 Application Topical 2-3 Times Daily   No current facility-administered medications for this visit. (Other)   REVIEW OF SYSTEMS: ROS   Positive for: Endocrine, Eyes Negative for: Constitutional, Gastrointestinal, Neurological, Skin, Genitourinary, Musculoskeletal, HENT, Cardiovascular, Respiratory, Psychiatric, Allergic/Imm, Heme/Lymph Last edited by Theodore Demark, COA on 02/03/2021  9:10 AM.     ALLERGIES No Known Allergies  PAST MEDICAL HISTORY Past Medical History:  Diagnosis Date   Cancer ([place])    Cataract    Diabetic retinopathy ([place])    Hypertension    Hypertensive retinopathy    Past Surgical History:  Procedure Laterality Date   ACHILLES TENDON SURGERY     ANKLE SURGERY     CARPAL TUNNEL WITH CUBITAL TUNNEL     MOHS SURGERY     has had 20+ surgeries for this on different areas of body   FAMILY HISTORY Family History  Problem Relation Age of Onset   Hypertension Mother    Diabetes Mother    Cataracts Mother    Cancer Paternal Grandmother    SOCIAL HISTORY  Social History   Tobacco Use   Smoking status: Never   Smokeless tobacco: Never  Substance Use Topics   Alcohol use: Not Currently       OPHTHALMIC EXAM: Base Eye Exam     Visual Acuity (Snellen - Linear)       Right Left   Dist cc 20/30 +2 20/40   Dist ph cc 20/25 -1 20/40 +1    Correction: Glasses         Tonometry (Tonopen, 9:08 AM)       Right Left   Pressure 13 13         Pupils       Dark Light Shape React APD   Right 3 2 Round Brisk None   Left 3 2 Round Brisk None         Visual Fields (Counting fingers)       Left Right    Full          Extraocular Movement       Right Left    Full Full          Neuro/Psych     Oriented x3: Yes   Mood/Affect: Normal         Dilation     Both eyes: 1.0% Mydriacyl, 2.5% Phenylephrine @ 9:08 AM           Slit Lamp and Fundus Exam     Slit Lamp Exam       Right Left   Lids/Lashes Dermatochalasis - upper lid Dermatochalasis - upper lid   Conjunctiva/Sclera White and quiet White and quiet   Cornea arcus arcus   Anterior Chamber deep, clear, narrow temporal angle deep, clear, narrow temporal angle   Iris Round and dilated, No NVI Round and dilated, No NVI   Lens Clear Trace Posterior subcapsular cataract   Vitreous mild syneresis mild syneresis         Fundus Exam       Right Left   Disc Pink and Sharp mild Pallor, Sharp rim   C/D Ratio 0.2 0.2   Macula Flat, blunted foveal reflex, +edema; scattered MA/DBH, boat shaped subhyalod heme IT to fovea -- improved and fading, circinate exudates temporal macula Blunted foveal reflex, circinate exudates and edema ST macula--improving, scattered MA/DBH   Vessels attenuated, mild tortuousity attenuated, mild tortuousity, scattered, early NV greatest superior to disc   Periphery Attached, 360 DBH, patches of NVE, good 360 PRP, room for fill in temporally and posteriorly if needed Attached, 360 DBH, patches of NVE regressing, good 360 PRP           Refraction     Wearing Rx       Sphere Cylinder Add   Right +1.75 Sphere +2.50   Left +1.75 Sphere +2.50           IMAGING AND PROCEDURES  Imaging and Procedures for 02/03/2021  OCT, Retina - OU - Both Eyes       Right Eye Quality was good. Central Foveal Thickness: 268. Progression has improved. Findings include abnormal foveal contour, intraretinal fluid, intraretinal hyper-reflective material, no SRF (interval improvement in IRF, IRHM and pre-retinal/subhyaloid heme temporal and IT mac).   Left Eye Quality was good. Central Foveal Thickness: 246. Progression has improved. Findings include abnormal foveal contour, intraretinal  fluid, intraretinal hyper-reflective material, no SRF, vitreomacular adhesion (Mild Interval improvement in IRF/IRHM temporal mac).   Notes *Images captured and stored on drive  Diagnosis / Impression:  +DME OU  OD: interval improvement in IRF, IRHM and pre-retinal/subhyaloid heme temporal and IT mac OS: Mild Interval improvement in IRF/IRHM temporal mac  Clinical management:  See below  Abbreviations: NFP - Normal foveal profile. CME - cystoid macular edema. PED - pigment epithelial detachment. IRF - intraretinal fluid. SRF - subretinal fluid. EZ - ellipsoid zone. ERM - epiretinal membrane. ORA - outer retinal atrophy. ORT - outer retinal tubulation. SRHM - subretinal hyper-reflective material. IRHM - intraretinal hyper-reflective material      Intravitreal Injection, Pharmacologic Agent - OD - Right Eye       Time Out 02/03/2021. 9:28 AM. Confirmed correct patient, procedure, site, and patient consented.   Anesthesia Topical anesthesia was used. Anesthetic medications included Lidocaine 2%, Proparacaine 0.5%.   Procedure Preparation included 5% betadine to ocular surface, eyelid speculum. A supplied (32g) needle was used.   Injection: 1.25 mg Bevacizumab 1.56m/0.05ml   Route: Intravitreal, Site: Right Eye   NDC:: 53664-403-47 Lot: [id number], Expiration date: 03/03/2021, Waste: 0 mL   Post-op Post injection exam found visual acuity of at least counting fingers. The patient tolerated the procedure well. There were no complications. The patient received written and verbal post procedure care education. Post injection medications were not given.      Intravitreal Injection, Pharmacologic Agent - OS - Left Eye       Time Out 02/03/2021. 9:28 AM. Confirmed correct patient, procedure, site, and patient consented.   Anesthesia Topical anesthesia was used. Anesthetic medications included Lidocaine 2%, Proparacaine 0.5%.   Procedure Preparation included 5% betadine to ocular  surface, eyelid speculum. A supplied (32g) needle was used.   Injection: 1.25 mg Bevacizumab 1.214m0.05ml   Route: Intravitreal, Site: Left Eye   NDC: [id number]: : [id number] date: 03/16/2021, Waste: 0.05 mL   Post-op Post injection exam found visual acuity of at least counting fingers. The patient tolerated the procedure well. There were no complications. The patient received written and verbal post procedure care education. Post injection medications were not given.             ASSESSMENT/PLAN:   ICD-10-CM   1. Proliferative diabetic retinopathy of both eyes with macular edema associated with type 2 diabetes mellitus (HCC)  E1C62.3762ntravitreal Injection, Pharmacologic Agent - OD - Right Eye    Intravitreal Injection, Pharmacologic Agent - OS - Left Eye    Bevacizumab (AVASTIN) SOLN 1.25 mg    Bevacizumab (AVASTIN) SOLN 1.25 mg    2. Retinal edema  H35.81 OCT, Retina - OU - Both Eyes    3. Essential hypertension  I10     4. Hypertensive retinopathy of both eyes  H35.033     5. Combined forms of age-related cataract of both eyes  H25.813      1,2. Proliferative diabetic retinopathy, both eyes  - s/p PRP OD (07.20.22)  - s/p PRP OS (08.03.22)  - s/p IVA OU #1 (08.17.22), #2 (09.14.22), #3 (10.12.22)  - history of poor BG control - exam shows scattered IRH and NVE OU; OD with small subhyaloid heme -- improving - FA (07.12.22) shows scattered patches of NVE OU - OCT shows OD: interval improvement in IRF, IRHM and pre-retinal/subhyaloid heme temporal and IT mac; OS: Mild Interval improvement in IRF/IRHM temporal mac - recommend IVA OU #4 today, 11.09.22 for DME - pt wishes to proceed with injections - RBA of procedure discussed, questions answered - informed consent obtained and signed - see procedure note - f/u in 4 wks -- DFE/OCT, possible injection(s)  3,4.  Hypertensive retinopathy OU - discussed importance of tight BP control - monitor  5. Mixed  Cataract OU - The symptoms of cataract, surgical options, and treatments and risks were discussed with patient. - discussed diagnosis and progression - not yet visually significant - monitor for now  Ophthalmic Meds Ordered this visit:  Meds ordered this encounter  Medications   Bevacizumab (AVASTIN) SOLN 1.25 mg   Bevacizumab (AVASTIN) SOLN 1.25 mg      Return in about 4 weeks (around 03/03/2021) for f/u PDR OU, DFE, OCT.  There are no Patient Instructions on file for this visit.  This document serves as a record of services personally performed by Gardiner Sleeper, MD, PhD. It was created on their behalf by Orvan Falconer, an ophthalmic technician. The creation of this record is the provider's dictation and/or activities during the visit.    Electronically signed by: Orvan Falconer, OA, 02/03/21  1:29 PM  This document serves as a record of services personally performed by Gardiner Sleeper, MD, PhD. It was created on their behalf by San Jetty. Owens Shark, OA an ophthalmic technician. The creation of this record is the provider's dictation and/or activities during the visit.    Electronically signed by: San Jetty. Owens Shark, [place] 11.09.2022 1:29 PM  Gardiner Sleeper, M.D., Ph.D. Diseases & Surgery of the Retina and Vitreous [facility]  I have reviewed the above documentation for accuracy and completeness, and I agree with the above. Gardiner Sleeper, M.D., Ph.D. 02/03/21 1:29 PM  Abbreviations: M myopia (nearsighted); A astigmatism; H hyperopia (farsighted); P presbyopia; Mrx spectacle prescription;  CTL contact lenses; OD right eye; OS left eye; OU both eyes  XT exotropia; ET esotropia; PEK punctate epithelial keratitis; PEE punctate epithelial erosions; DES dry eye syndrome; MGD meibomian gland dysfunction; ATs artificial tears; PFAT's preservative free artificial tears; [place] nuclear sclerotic cataract; PSC posterior subcapsular cataract; ERM epi-retinal membrane; PVD  posterior vitreous detachment; RD retinal detachment; DM diabetes mellitus; DR diabetic retinopathy; NPDR non-proliferative diabetic retinopathy; PDR proliferative diabetic retinopathy; CSME clinically significant macular edema; DME diabetic macular edema; dbh dot blot hemorrhages; CWS cotton wool spot; POAG primary open angle glaucoma; C/D cup-to-disc ratio; HVF humphrey visual field; GVF goldmann visual field; OCT optical coherence tomography; IOP intraocular pressure; BRVO Branch retinal vein occlusion; CRVO central retinal vein occlusion; CRAO central retinal artery occlusion; BRAO branch retinal artery occlusion; RT retinal tear; SB scleral buckle; PPV pars plana vitrectomy; VH Vitreous hemorrhage; PRP panretinal laser photocoagulation; IVK intravitreal kenalog; VMT vitreomacular traction; MH Macular hole;  NVD neovascularization of the disc; NVE neovascularization elsewhere; AREDS age related eye disease study; ARMD age related macular degeneration; POAG primary open angle glaucoma; EBMD epithelial/anterior basement membrane dystrophy; ACIOL anterior chamber intraocular lens; IOL intraocular lens; PCIOL posterior chamber intraocular lens; Phaco/IOL phacoemulsification with intraocular lens placement; [place] photorefractive keratectomy; LASIK laser assisted in situ keratomileusis; HTN hypertension; DM diabetes mellitus; COPD chronic obstructive pulmonary disease

## 2021-02-03 ENCOUNTER — Ambulatory Visit (INDEPENDENT_AMBULATORY_CARE_PROVIDER_SITE_OTHER): Payer: BC Managed Care – PPO | Admitting: Ophthalmology

## 2021-02-03 ENCOUNTER — Encounter (INDEPENDENT_AMBULATORY_CARE_PROVIDER_SITE_OTHER): Payer: Self-pay | Admitting: Ophthalmology

## 2021-02-03 ENCOUNTER — Other Ambulatory Visit: Payer: Self-pay

## 2021-02-03 DIAGNOSIS — E113513 Type 2 diabetes mellitus with proliferative diabetic retinopathy with macular edema, bilateral: Secondary | ICD-10-CM | POA: Diagnosis not present

## 2021-02-03 DIAGNOSIS — I10 Essential (primary) hypertension: Secondary | ICD-10-CM

## 2021-02-03 DIAGNOSIS — H25813 Combined forms of age-related cataract, bilateral: Secondary | ICD-10-CM

## 2021-02-03 DIAGNOSIS — H35033 Hypertensive retinopathy, bilateral: Secondary | ICD-10-CM | POA: Diagnosis not present

## 2021-02-03 DIAGNOSIS — H3581 Retinal edema: Secondary | ICD-10-CM

## 2021-02-03 MED ORDER — BEVACIZUMAB CHEMO INJECTION 1.25MG/0.05ML SYRINGE FOR KALEIDOSCOPE
1.2500 mg | INTRAVITREAL | Status: AC | PRN
  Administered 2021-02-03: 1.25 mg via INTRAVITREAL

## 2021-02-10 DIAGNOSIS — E1141 Type 2 diabetes mellitus with diabetic mononeuropathy: Secondary | ICD-10-CM | POA: Diagnosis not present

## 2021-02-10 DIAGNOSIS — Z6833 Body mass index (BMI) 33.0-33.9, adult: Secondary | ICD-10-CM | POA: Diagnosis not present

## 2021-02-10 DIAGNOSIS — E1169 Type 2 diabetes mellitus with other specified complication: Secondary | ICD-10-CM | POA: Diagnosis not present

## 2021-02-10 DIAGNOSIS — Z794 Long term (current) use of insulin: Secondary | ICD-10-CM | POA: Diagnosis not present

## 2021-02-24 DIAGNOSIS — C4441 Basal cell carcinoma of skin of scalp and neck: Secondary | ICD-10-CM | POA: Diagnosis not present

## 2021-03-01 NOTE — Progress Notes (Shared)
[facility] Note  03/03/2021     CHIEF COMPLAINT Patient presents for No chief complaint on file.   HISTORY OF PRESENT ILLNESS: Jason Bowen is a 60 y.o. male who presents to the clinic today for:       Referring physician: No referring provider defined for this encounter.  HISTORICAL INFORMATION:   Selected notes from the MEDICAL RECORD NUMBER Referred by [name] for eval of DR w/DME OS   CURRENT MEDICATIONS: No current outpatient medications on file. (Ophthalmic Drugs)   No current facility-administered medications for this visit. (Ophthalmic Drugs)   Current Outpatient Medications (Other)  Medication Sig   dapagliflozin propanediol (FARXIGA) 10 MG TABS tablet Take 10 mg by mouth daily.   Insulin Degludec (TRESIBA) 100 UNIT/ML SOLN Inject 36 Units into the skin daily.   insulin lispro (HUMALOG) 100 UNIT/ML injection Inject 15 Units into the skin 3 (three) times daily before meals.   lisinopril (ZESTRIL) 20 MG tablet Take 20 mg by mouth daily.   lisinopril (ZESTRIL) 20 MG tablet 1 tablet (Patient not taking: Reported on 12/09/2020)   metFORMIN (GLUCOPHAGE) 500 MG tablet Take 500 mg by mouth 2 (two) times daily with a meal.   metFORMIN (GLUCOPHAGE-XR) 500 MG 24 hr tablet 1 tablet with evening meal (Patient not taking: Reported on 12/09/2020)   triamcinolone cream (KENALOG) 0.1 % SMARTSIG:1 Application Topical 2-3 Times Daily   No current facility-administered medications for this visit. (Other)   REVIEW OF SYSTEMS:   ALLERGIES No Known Allergies  PAST MEDICAL HISTORY Past Medical History:  Diagnosis Date   Cancer ([place])    Cataract    Diabetic retinopathy ([place])    Hypertension    Hypertensive retinopathy    Past Surgical History:  Procedure Laterality Date   ACHILLES TENDON SURGERY     ANKLE SURGERY     CARPAL TUNNEL WITH CUBITAL TUNNEL     MOHS SURGERY     has had 20+ surgeries for this on different areas of body   FAMILY  HISTORY Family History  Problem Relation Age of Onset   Hypertension Mother    Diabetes Mother    Cataracts Mother    Cancer Paternal Grandmother    SOCIAL HISTORY Social History   Tobacco Use   Smoking status: Never   Smokeless tobacco: Never  Substance Use Topics   Alcohol use: Not Currently       OPHTHALMIC EXAM: Not recorded    IMAGING AND PROCEDURES  Imaging and Procedures for 03/03/2021           ASSESSMENT/PLAN: No diagnosis found.  1,2. Proliferative diabetic retinopathy, both eyes  - s/p PRP OD (07.20.22)  - s/p PRP OS (08.03.22)  - s/p IVA OU #1 (08.17.22), #2 (09.14.22), #3 (10.12.22), #4 (11.09.22)  - history of poor BG control - exam shows scattered IRH and NVE OU; OD with small subhyaloid heme -- improving - FA (07.12.22) shows scattered patches of NVE OU - OCT shows OD: interval improvement in IRF, IRHM and pre-retinal/subhyaloid heme temporal and IT mac; OS: Mild Interval improvement in IRF/IRHM temporal mac - recommend IVA OU #5 today, 12.07.22 for DME - pt wishes to proceed with injections - RBA of procedure discussed, questions answered - informed consent obtained and signed - see procedure note - f/u in 4 wks -- DFE/OCT, possible injection(s)  3,4. Hypertensive retinopathy OU - discussed importance of tight BP control - monitor  5. Mixed Cataract OU - The symptoms  of cataract, surgical options, and treatments and risks were discussed with patient. - discussed diagnosis and progression - not yet visually significant - monitor for now  Ophthalmic Meds Ordered this visit:  No orders of the defined types were placed in this encounter.     No follow-ups on file.  There are no Patient Instructions on file for this visit.  This document serves as a record of services personally performed by Gardiner Sleeper, MD, PhD. It was created on their behalf by Orvan Falconer, an ophthalmic technician. The creation of this record is the  provider's dictation and/or activities during the visit.    Electronically signed by: Orvan Falconer, OA, 03/01/21  10:44 AM   Gardiner Sleeper, M.D., Ph.D. Diseases & Surgery of the Retina and Vitreous [facility]  I have reviewed the above documentation for accuracy and completeness, and I agree with the above. Gardiner Sleeper, M.D., Ph.D. 02/03/21 10:44 AM  Abbreviations: M myopia (nearsighted); A astigmatism; H hyperopia (farsighted); P presbyopia; Mrx spectacle prescription;  CTL contact lenses; OD right eye; OS left eye; OU both eyes  XT exotropia; ET esotropia; PEK punctate epithelial keratitis; PEE punctate epithelial erosions; DES dry eye syndrome; MGD meibomian gland dysfunction; ATs artificial tears; PFAT's preservative free artificial tears; [place] nuclear sclerotic cataract; PSC posterior subcapsular cataract; ERM epi-retinal membrane; PVD posterior vitreous detachment; RD retinal detachment; DM diabetes mellitus; DR diabetic retinopathy; NPDR non-proliferative diabetic retinopathy; PDR proliferative diabetic retinopathy; CSME clinically significant macular edema; DME diabetic macular edema; dbh dot blot hemorrhages; CWS cotton wool spot; POAG primary open angle glaucoma; C/D cup-to-disc ratio; HVF humphrey visual field; GVF goldmann visual field; OCT optical coherence tomography; IOP intraocular pressure; BRVO Branch retinal vein occlusion; CRVO central retinal vein occlusion; CRAO central retinal artery occlusion; BRAO branch retinal artery occlusion; RT retinal tear; SB scleral buckle; PPV pars plana vitrectomy; VH Vitreous hemorrhage; PRP panretinal laser photocoagulation; IVK intravitreal kenalog; VMT vitreomacular traction; MH Macular hole;  NVD neovascularization of the disc; NVE neovascularization elsewhere; AREDS age related eye disease study; ARMD age related macular degeneration; POAG primary open angle glaucoma; EBMD epithelial/anterior basement membrane  dystrophy; ACIOL anterior chamber intraocular lens; IOL intraocular lens; PCIOL posterior chamber intraocular lens; Phaco/IOL phacoemulsification with intraocular lens placement; [place] photorefractive keratectomy; LASIK laser assisted in situ keratomileusis; HTN hypertension; DM diabetes mellitus; COPD chronic obstructive pulmonary disease

## 2021-03-03 ENCOUNTER — Encounter (INDEPENDENT_AMBULATORY_CARE_PROVIDER_SITE_OTHER): Payer: BC Managed Care – PPO | Admitting: Ophthalmology

## 2021-03-03 DIAGNOSIS — I10 Essential (primary) hypertension: Secondary | ICD-10-CM

## 2021-03-03 DIAGNOSIS — H35033 Hypertensive retinopathy, bilateral: Secondary | ICD-10-CM

## 2021-03-03 DIAGNOSIS — H25813 Combined forms of age-related cataract, bilateral: Secondary | ICD-10-CM

## 2021-03-03 DIAGNOSIS — E113513 Type 2 diabetes mellitus with proliferative diabetic retinopathy with macular edema, bilateral: Secondary | ICD-10-CM

## 2021-03-03 DIAGNOSIS — H3581 Retinal edema: Secondary | ICD-10-CM

## 2021-03-11 NOTE — Progress Notes (Signed)
[facility] Note  03/17/2021     CHIEF COMPLAINT Patient presents for Retina Follow Up    HISTORY OF PRESENT ILLNESS: Jason Bowen is a 60 y.o. male who presents to the clinic today for:    HPI     Retina Follow Up   Patient presents with  Diabetic Retinopathy.  In both eyes.  Duration of 4 weeks.  Since onset it is stable.  I, the attending physician,  performed the HPI with the patient and updated documentation appropriately.        Comments   4 week follow up PDR OU- Doing well.  Patient plays pickle ball.  When the ball is hit to him it will take him a minute to see the ball up when it is coming towards him.  O/w no new problems.  BS 128 this am A1C 8.3      Last edited by [name], MD on 03/17/2021  9:19 AM.    Pt was recently put on Mounjaro and states his blood sugar have been very good (between 120-150), no change in [place]  Referring physician: No referring provider defined for this encounter.  HISTORICAL INFORMATION:   Selected notes from the MEDICAL RECORD NUMBER Referred by [name] for eval of DR w/DME OS   CURRENT MEDICATIONS: No current outpatient medications on file. (Ophthalmic Drugs)   No current facility-administered medications for this visit. (Ophthalmic Drugs)   Current Outpatient Medications (Other)  Medication Sig   dapagliflozin propanediol (FARXIGA) 10 MG TABS tablet Take 10 mg by mouth daily.   Insulin Degludec (TRESIBA) 100 UNIT/ML SOLN Inject 36 Units into the skin daily.   insulin lispro (HUMALOG) 100 UNIT/ML injection Inject 15 Units into the skin 3 (three) times daily before meals.   lisinopril (ZESTRIL) 20 MG tablet Take 20 mg by mouth daily.   metFORMIN (GLUCOPHAGE) 500 MG tablet Take 500 mg by mouth 2 (two) times daily with a meal.   tirzepatide (MOUNJARO) 5 MG/0.5ML Pen Inject 5 mg into the skin once a week.   triamcinolone cream (KENALOG) 0.1 % SMARTSIG:1 Application Topical 2-3 Times Daily    lisinopril (ZESTRIL) 20 MG tablet 1 tablet (Patient not taking: Reported on 12/09/2020)   metFORMIN (GLUCOPHAGE-XR) 500 MG 24 hr tablet 1 tablet with evening meal (Patient not taking: Reported on 12/09/2020)   No current facility-administered medications for this visit. (Other)   REVIEW OF SYSTEMS: ROS   Positive for: Endocrine, Eyes Negative for: Constitutional, Gastrointestinal, Neurological, Skin, Genitourinary, Musculoskeletal, HENT, Cardiovascular, Respiratory, Psychiatric, Allergic/Imm, Heme/Lymph Last edited by [name], COA on 03/17/2021  8:33 AM.     ALLERGIES No Known Allergies  PAST MEDICAL HISTORY Past Medical History:  Diagnosis Date   Cancer ([place])    Cataract    Diabetic retinopathy ([place])    Hypertension    Hypertensive retinopathy    Past Surgical History:  Procedure Laterality Date   ACHILLES TENDON SURGERY     ANKLE SURGERY     CARPAL TUNNEL WITH CUBITAL TUNNEL     MOHS SURGERY     has had 20+ surgeries for this on different areas of body   FAMILY HISTORY Family History  Problem Relation Age of Onset   Hypertension Mother    Diabetes Mother    Cataracts Mother    Cancer Paternal Grandmother    SOCIAL HISTORY Social History   Tobacco Use   Smoking status: Never   Smokeless tobacco: Never  Substance Use  Topics   Alcohol use: Not Currently       OPHTHALMIC EXAM: Base Eye Exam     Visual Acuity (Snellen - Linear)       Right Left   Dist cc 20/20 -2 20/40 -2   Dist ph cc  20/40 +2    Correction: Glasses         Tonometry (Tonopen, 8:41 AM)       Right Left   Pressure 17 14         Pupils       Dark Light Shape React APD   Right 3 2 Round Brisk None   Left 3 2 Round Brisk None         Visual Fields (Counting fingers)       Left Right    Full Full         Extraocular Movement       Right Left    Full Full         Neuro/Psych     Oriented x3: Yes   Mood/Affect: Normal         Dilation     Both  eyes: 1.0% Mydriacyl, 2.5% Phenylephrine @ 8:41 AM           Slit Lamp and Fundus Exam     Slit Lamp Exam       Right Left   Lids/Lashes Dermatochalasis - upper lid Dermatochalasis - upper lid   Conjunctiva/Sclera White and quiet White and quiet   Cornea arcus arcus   Anterior Chamber deep, clear, narrow temporal angle deep, clear, narrow temporal angle   Iris Round and dilated, No NVI Round and dilated, No NVI   Lens Clear Trace Posterior subcapsular cataract   Anterior Vitreous mild syneresis mild syneresis         Fundus Exam       Right Left   Disc Pink and Sharp mild Pallor, Sharp rim   C/D Ratio 0.2 0.2   Macula Flat, blunted foveal reflex, +edema; scattered MA/DBH, boat shaped subhyalod heme IT to fovea -- improved and fading -- no longer red, circinate exudates temporal macula - improving Blunted foveal reflex, circinate exudates and edema ST macula--improving, scattered MA/DBH   Vessels attenuated, mild tortuousity attenuated, mild tortuousity, scattered, early NV greatest superior to disc   Periphery Attached, 360 DBH, patches of NVE, good 360 PRP, room for fill in temporally and posteriorly if needed Attached, 360 DBH, patches of NVE regressing, good 360 PRP           Refraction     Wearing Rx       Sphere Cylinder Add   Right +1.75 Sphere +2.50   Left +1.75 Sphere +2.50           IMAGING AND PROCEDURES  Imaging and Procedures for 03/17/2021  OCT, Retina - OU - Both Eyes       Right Eye Quality was good. Central Foveal Thickness: 265. Progression has improved. Findings include abnormal foveal contour, intraretinal fluid, intraretinal hyper-reflective material, no SRF (Mild interval improvement in IRF/IRHM and temporal macula and fovea, interval increase focal SRF SN mac).   Left Eye Quality was good. Central Foveal Thickness: 248. Progression has worsened. Findings include abnormal foveal contour, intraretinal fluid, intraretinal hyper-reflective  material, no SRF, vitreomacular adhesion (Persistent IRF/IRHM temporal macula -- slightly increased).   Notes *Images captured and stored on drive  Diagnosis / Impression:  +DME OU OD: Mild interval improvement in IRF/IRHM and temporal  macula and fovea, interval increase focal SRF SN mac OS: Persistent IRF/IRHM temporal macula -- slightly increased  Clinical management:  See below  Abbreviations: NFP - Normal foveal profile. CME - cystoid macular edema. PED - pigment epithelial detachment. IRF - intraretinal fluid. SRF - subretinal fluid. EZ - ellipsoid zone. ERM - epiretinal membrane. ORA - outer retinal atrophy. ORT - outer retinal tubulation. SRHM - subretinal hyper-reflective material. IRHM - intraretinal hyper-reflective material      Intravitreal Injection, Pharmacologic Agent - OD - Right Eye       Time Out 03/17/2021. 9:03 AM. Confirmed correct patient, procedure, site, and patient consented.   Anesthesia Topical anesthesia was used. Anesthetic medications included Lidocaine 2%, Proparacaine 0.5%.   Procedure Preparation included 5% betadine to ocular surface, eyelid speculum. A supplied needle was used.   Injection: 1.25 mg Bevacizumab 1.65m/0.05ml   Route: Intravitreal, Site: Right Eye   NDC:: 59563-875-64 Lot: [id number] , Expiration date: 05/04/2021, Waste: 0 mL   Post-op Post injection exam found visual acuity of at least counting fingers. The patient tolerated the procedure well. There were no complications. The patient received written and verbal post procedure care education. Post injection medications were not given.      Intravitreal Injection, Pharmacologic Agent - OS - Left Eye       Time Out 03/17/2021. 9:04 AM. Confirmed correct patient, procedure, site, and patient consented.   Anesthesia Topical anesthesia was used. Anesthetic medications included Lidocaine 2%, Proparacaine 0.5%.   Procedure Preparation included 5% betadine to ocular surface,  eyelid speculum. A supplied needle was used.   Injection: 1.25 mg Bevacizumab 1.274m0.05ml   Route: Intravitreal, Site: Left Eye   NDC: [id number]: [id number] date: 05/01/2021, Waste: 0 mL   Post-op Post injection exam found visual acuity of at least counting fingers. The patient tolerated the procedure well. There were no complications. The patient received written and verbal post procedure care education. Post injection medications were not given.            ASSESSMENT/PLAN:   ICD-10-CM   1. Proliferative diabetic retinopathy of both eyes with macular edema associated with type 2 diabetes mellitus (HCC)  E11.3513 OCT, Retina - OU - Both Eyes    Intravitreal Injection, Pharmacologic Agent - OD - Right Eye    Intravitreal Injection, Pharmacologic Agent - OS - Left Eye    Bevacizumab (AVASTIN) SOLN 1.25 mg    Bevacizumab (AVASTIN) SOLN 1.25 mg    2. Essential hypertension  I10     3. Hypertensive retinopathy of both eyes  H35.033     4. Combined forms of age-related cataract of both eyes  H25.813      1. Proliferative diabetic retinopathy, both eyes  - pt is delayed to follow up from 4 weeks to 6 weeks  - s/p PRP OD (07.20.22)  - s/p PRP OS (08.03.22)  - s/p IVA OU #1 (08.17.22), #2 (09.14.22), #3 (10.12.22), #4 (11.09.22)  - history of poor BG control - exam shows scattered IRH and NVE OU; OD with small subhyaloid heme -- improving, no longer red - FA (07.12.22) shows scattered patches of NVE OU - OCT shows OD: Mild interval improvement in IRF/IRHM and temporal macula and fovea, interval increase focal SRF SN mac; OS: Persistent IRF/IRHM temporal macula -- slightly increased - recommend IVA OU #5 today, 12.21.22 for DME - pt wishes to proceed with injections - RBA of procedure discussed, questions answered - informed consent obtained and signed - see procedure note -  f/u in 4 wks -- DFE/OCT, possible injection(s)  2,3. Hypertensive retinopathy OU -  discussed importance of tight BP control - monitor  4. Mixed Cataract OU - The symptoms of cataract, surgical options, and treatments and risks were discussed with patient. - discussed diagnosis and progression - not yet visually significant - monitor for now  Ophthalmic Meds Ordered this visit:  Meds ordered this encounter  Medications   Bevacizumab (AVASTIN) SOLN 1.25 mg   Bevacizumab (AVASTIN) SOLN 1.25 mg     Return in about 4 weeks (around 04/14/2021) for f/u PDR OU, DFE, OCT.  There are no Patient Instructions on file for this visit.  This document serves as a record of services personally performed by Gardiner Sleeper, MD, PhD. It was created on their behalf by Orvan Falconer, an ophthalmic technician. The creation of this record is the provider's dictation and/or activities during the visit.    Electronically signed by: Orvan Falconer, OA, 03/17/21  9:20 AM  This document serves as a record of services personally performed by Gardiner Sleeper, MD, PhD. It was created on their behalf by San Jetty. Owens Shark, OA an ophthalmic technician. The creation of this record is the provider's dictation and/or activities during the visit.    Electronically signed by: San Jetty. Owens Shark, [place] 12.21.2022 9:20 AM  Gardiner Sleeper, M.D., Ph.D. Diseases & Surgery of the Retina and Vitreous [facility]  I have reviewed the above documentation for accuracy and completeness, and I agree with the above. Gardiner Sleeper, M.D., Ph.D. 03/17/21 9:20 AM   Abbreviations: M myopia (nearsighted); A astigmatism; H hyperopia (farsighted); P presbyopia; Mrx spectacle prescription;  CTL contact lenses; OD right eye; OS left eye; OU both eyes  XT exotropia; ET esotropia; PEK punctate epithelial keratitis; PEE punctate epithelial erosions; DES dry eye syndrome; MGD meibomian gland dysfunction; ATs artificial tears; PFAT's preservative free artificial tears; [place] nuclear sclerotic cataract; PSC  posterior subcapsular cataract; ERM epi-retinal membrane; PVD posterior vitreous detachment; RD retinal detachment; DM diabetes mellitus; DR diabetic retinopathy; NPDR non-proliferative diabetic retinopathy; PDR proliferative diabetic retinopathy; CSME clinically significant macular edema; DME diabetic macular edema; dbh dot blot hemorrhages; CWS cotton wool spot; POAG primary open angle glaucoma; C/D cup-to-disc ratio; HVF humphrey visual field; GVF goldmann visual field; OCT optical coherence tomography; IOP intraocular pressure; BRVO Branch retinal vein occlusion; CRVO central retinal vein occlusion; CRAO central retinal artery occlusion; BRAO branch retinal artery occlusion; RT retinal tear; SB scleral buckle; PPV pars plana vitrectomy; VH Vitreous hemorrhage; PRP panretinal laser photocoagulation; IVK intravitreal kenalog; VMT vitreomacular traction; MH Macular hole;  NVD neovascularization of the disc; NVE neovascularization elsewhere; AREDS age related eye disease study; ARMD age related macular degeneration; POAG primary open angle glaucoma; EBMD epithelial/anterior basement membrane dystrophy; ACIOL anterior chamber intraocular lens; IOL intraocular lens; PCIOL posterior chamber intraocular lens; Phaco/IOL phacoemulsification with intraocular lens placement; [place] photorefractive keratectomy; LASIK laser assisted in situ keratomileusis; HTN hypertension; DM diabetes mellitus; COPD chronic obstructive pulmonary disease

## 2021-03-16 DIAGNOSIS — E1141 Type 2 diabetes mellitus with diabetic mononeuropathy: Secondary | ICD-10-CM | POA: Diagnosis not present

## 2021-03-16 DIAGNOSIS — Z6833 Body mass index (BMI) 33.0-33.9, adult: Secondary | ICD-10-CM | POA: Diagnosis not present

## 2021-03-16 DIAGNOSIS — E1169 Type 2 diabetes mellitus with other specified complication: Secondary | ICD-10-CM | POA: Diagnosis not present

## 2021-03-16 DIAGNOSIS — Z794 Long term (current) use of insulin: Secondary | ICD-10-CM | POA: Diagnosis not present

## 2021-03-17 ENCOUNTER — Ambulatory Visit (INDEPENDENT_AMBULATORY_CARE_PROVIDER_SITE_OTHER): Payer: BC Managed Care – PPO | Admitting: Ophthalmology

## 2021-03-17 ENCOUNTER — Other Ambulatory Visit: Payer: Self-pay

## 2021-03-17 ENCOUNTER — Encounter (INDEPENDENT_AMBULATORY_CARE_PROVIDER_SITE_OTHER): Payer: Self-pay | Admitting: Ophthalmology

## 2021-03-17 DIAGNOSIS — H25813 Combined forms of age-related cataract, bilateral: Secondary | ICD-10-CM

## 2021-03-17 DIAGNOSIS — I10 Essential (primary) hypertension: Secondary | ICD-10-CM | POA: Diagnosis not present

## 2021-03-17 DIAGNOSIS — E113513 Type 2 diabetes mellitus with proliferative diabetic retinopathy with macular edema, bilateral: Secondary | ICD-10-CM

## 2021-03-17 DIAGNOSIS — H35033 Hypertensive retinopathy, bilateral: Secondary | ICD-10-CM

## 2021-03-17 MED ORDER — BEVACIZUMAB CHEMO INJECTION 1.25MG/0.05ML SYRINGE FOR KALEIDOSCOPE
1.2500 mg | INTRAVITREAL | Status: AC | PRN
Start: 2021-03-17 — End: 2021-03-17
  Administered 2021-03-17: 09:00:00 1.25 mg via INTRAVITREAL

## 2021-04-15 ENCOUNTER — Encounter (INDEPENDENT_AMBULATORY_CARE_PROVIDER_SITE_OTHER): Payer: BC Managed Care – PPO | Admitting: Ophthalmology

## 2021-04-15 NOTE — Progress Notes (Signed)
[facility] Note  04/16/2021     CHIEF COMPLAINT Patient presents for Retina Follow Up    HISTORY OF PRESENT ILLNESS: Jason Bowen is a 61 y.o. male who presents to the clinic today for:    HPI     Retina Follow Up   Patient presents with  Diabetic Retinopathy.  In both eyes.  This started 4 weeks ago.  I, the attending physician,  performed the HPI with the patient and updated documentation appropriately.        Comments   Patient here for 4 weeks retina follow up for PDR OU. Patient states vision doing good. No eye pain.       Last edited by [name], MD on 04/16/2021 10:52 AM.     Referring physician: No referring provider defined for this encounter.  HISTORICAL INFORMATION:   Selected notes from the MEDICAL RECORD NUMBER Referred by [name] for eval of DR w/DME OS   CURRENT MEDICATIONS: No current outpatient medications on file. (Ophthalmic Drugs)   No current facility-administered medications for this visit. (Ophthalmic Drugs)   Current Outpatient Medications (Other)  Medication Sig   dapagliflozin propanediol (FARXIGA) 10 MG TABS tablet Take 10 mg by mouth daily.   Insulin Degludec (TRESIBA) 100 UNIT/ML SOLN Inject 36 Units into the skin daily.   insulin lispro (HUMALOG) 100 UNIT/ML injection Inject 15 Units into the skin 3 (three) times daily before meals.   lisinopril (ZESTRIL) 20 MG tablet Take 20 mg by mouth daily.   metFORMIN (GLUCOPHAGE) 500 MG tablet Take 500 mg by mouth 2 (two) times daily with a meal.   tirzepatide (MOUNJARO) 5 MG/0.5ML Pen Inject 5 mg into the skin once a week.   triamcinolone cream (KENALOG) 0.1 % SMARTSIG:1 Application Topical 2-3 Times Daily   lisinopril (ZESTRIL) 20 MG tablet 1 tablet (Patient not taking: Reported on 12/09/2020)   metFORMIN (GLUCOPHAGE-XR) 500 MG 24 hr tablet 1 tablet with evening meal (Patient not taking: Reported on 12/09/2020)   No current facility-administered medications  for this visit. (Other)   REVIEW OF SYSTEMS: ROS   Positive for: Endocrine, Eyes Negative for: Constitutional, Gastrointestinal, Neurological, Skin, Genitourinary, Musculoskeletal, HENT, Cardiovascular, Respiratory, Psychiatric, Allergic/Imm, Heme/Lymph Last edited by Theodore Demark, COA on 04/16/2021  9:57 AM.     ALLERGIES No Known Allergies  PAST MEDICAL HISTORY Past Medical History:  Diagnosis Date   Cancer ([place])    Cataract    Diabetic retinopathy ([place])    Hypertension    Hypertensive retinopathy    Past Surgical History:  Procedure Laterality Date   ACHILLES TENDON SURGERY     ANKLE SURGERY     CARPAL TUNNEL WITH CUBITAL TUNNEL     MOHS SURGERY     has had 20+ surgeries for this on different areas of body   FAMILY HISTORY Family History  Problem Relation Age of Onset   Hypertension Mother    Diabetes Mother    Cataracts Mother    Cancer Paternal Grandmother    SOCIAL HISTORY Social History   Tobacco Use   Smoking status: Never   Smokeless tobacco: Never  Vaping Use   Vaping Use: Never used  Substance Use Topics   Alcohol use: Not Currently       OPHTHALMIC EXAM: Base Eye Exam     Visual Acuity (Snellen - Linear)       Right Left   Dist cc 20/20 -1 20/40 -1   Dist ph  cc  20/40 +2    Correction: Glasses         Tonometry (Tonopen, 9:56 AM)       Right Left   Pressure 10 12         Pupils       Dark Light Shape React APD   Right 3 2 Round Brisk None   Left              Visual Fields (Counting fingers)       Left Right    Full Full         Extraocular Movement       Right Left    Full, Ortho Full, Ortho         Neuro/Psych     Oriented x3: Yes   Mood/Affect: Normal         Dilation     Both eyes: 1.0% Mydriacyl, 2.5% Phenylephrine @ 9:55 AM           Slit Lamp and Fundus Exam     Slit Lamp Exam       Right Left   Lids/Lashes Dermatochalasis - upper lid Dermatochalasis - upper lid    Conjunctiva/Sclera White and quiet White and quiet   Cornea arcus arcus   Anterior Chamber deep, clear, narrow temporal angle deep, clear, narrow temporal angle   Iris Round and dilated, No NVI Round and dilated, No NVI   Lens Clear Trace Posterior subcapsular cataract   Anterior Vitreous mild syneresis mild syneresis         Fundus Exam       Right Left   Disc Pink and Sharp mild Pallor, Sharp rim   C/D Ratio 0.2 0.2   Macula Flat, blunted foveal reflex, +edema; scattered MA/DBH, boat shaped subhyalod heme IT to fovea --resolved, circinate exudates temporal macula - improving Blunted foveal reflex, circinate exudates and edema ST macula--improving, scattered MA/DBH   Vessels attenuated, mild tortuousity attenuated, mild tortuousity, scattered, early NV greatest superior to disc--regressing   Periphery Attached, 360 DBH, patches of NVE, good 360 PRP, room for fill in temporally and posteriorly if needed Attached, 360 DBH, patches of NVE regressing, good 360 PRP           Refraction     Wearing Rx       Sphere Cylinder Add   Right +1.75 Sphere +2.50   Left +1.75 Sphere +2.50           IMAGING AND PROCEDURES  Imaging and Procedures for 04/16/2021  OCT, Retina - OU - Both Eyes       Right Eye Quality was good. Central Foveal Thickness: 257. Progression has improved. Findings include abnormal foveal contour, intraretinal fluid, intraretinal hyper-reflective material, no SRF (Mild interval improvement in IRF/IRHM and preretinal hyperreflective material).   Left Eye Quality was good. Central Foveal Thickness: 248. Progression has improved. Findings include abnormal foveal contour, intraretinal fluid, intraretinal hyper-reflective material, no SRF, vitreomacular adhesion (Interval improvement in IRF/IRHM temporal macula ).   Notes *Images captured and stored on drive  Diagnosis / Impression:  +DME OU OD: Mild interval improvement in IRF/IRHM and preretinal  hyperreflective material OS: interval improvement in IRF/IRHM temporal macula   Clinical management:  See below  Abbreviations: NFP - Normal foveal profile. CME - cystoid macular edema. PED - pigment epithelial detachment. IRF - intraretinal fluid. SRF - subretinal fluid. EZ - ellipsoid zone. ERM - epiretinal membrane. ORA - outer retinal atrophy. ORT - outer retinal tubulation.  SRHM - subretinal hyper-reflective material. IRHM - intraretinal hyper-reflective material      Intravitreal Injection, Pharmacologic Agent - OD - Right Eye       Time Out 04/16/2021. 10:14 AM. Confirmed correct patient, procedure, site, and patient consented.   Anesthesia Topical anesthesia was used. Anesthetic medications included Lidocaine 2%, Proparacaine 0.5%.   Procedure Preparation included 5% betadine to ocular surface, eyelid speculum. A supplied needle was used.   Injection: 1.25 mg Bevacizumab 1.25mg /0.29ml   Route: Intravitreal, Site: Right Eye   NDC: H061816, Lot: [id number] , Expiration date: 06/09/2021, Waste: 0 mL   Post-op Post injection exam found visual acuity of at least counting fingers. The patient tolerated the procedure well. There were no complications. The patient received written and verbal post procedure care education. Post injection medications were not given.      Intravitreal Injection, Pharmacologic Agent - OS - Left Eye       Time Out 04/16/2021. 10:14 AM. Confirmed correct patient, procedure, site, and patient consented.   Anesthesia Topical anesthesia was used. Anesthetic medications included Lidocaine 2%, Proparacaine 0.5%.   Procedure Preparation included 5% betadine to ocular surface, eyelid speculum. A (32g) needle was used.   Injection: 1.25 mg Bevacizumab 1.25mg /0.13ml   Route: Intravitreal, Site: Left Eye   NDC: 80m, [id number], Expiration date: 05/06/2021, Waste: 0.05 mL   Post-op Post injection exam found visual acuity of at least  counting fingers. The patient tolerated the procedure well. There were no complications. The patient received written and verbal post procedure care education. Post injection medications were not given.            ASSESSMENT/PLAN:   ICD-10-CM   1. Proliferative diabetic retinopathy of both eyes with macular edema associated with type 2 diabetes mellitus (HCC)  E11.3513 OCT, Retina - OU - Both Eyes    Intravitreal Injection, Pharmacologic Agent - OD - Right Eye    Intravitreal Injection, Pharmacologic Agent - OS - Left Eye    Bevacizumab (AVASTIN) SOLN 1.25 mg    Bevacizumab (AVASTIN) SOLN 1.25 mg    2. Essential hypertension  I10     3. Hypertensive retinopathy of both eyes  H35.033     4. Combined forms of age-related cataract of both eyes  H25.813       1. Proliferative diabetic retinopathy, both eyes  - pt is delayed to follow up from 4 weeks to 6 weeks  - s/p PRP OD (07.20.22)  - s/p PRP OS (08.03.22)  - s/p IVA OU #1 (08.17.22), #2 (09.14.22), #3 (10.12.22), #4 (11.09.22), #5 (12.21.22)  - history of poor BG control  - BCVA OD: 20/20-2--stable; OS: 20/40+2--stable - exam shows scattered IRH and NVE OU; OD with small subhyaloid heme -- white and fading - FA (07.12.22) shows scattered patches of NVE OU - OCT shows OD: Mild interval improvement in IRF/IRHM and temporal macula and fovea; OS: interval improvement in IRF/IRHM temporal macula - recommend IVA OU #6 today, 01.20.23 for DME - pt wishes to proceed with injections - RBA of procedure discussed, questions answered - informed consent obtained and signed - see procedure note - f/u in 4 wks -- DFE/OCT, possible injection(s)  2,3. Hypertensive retinopathy OU - discussed importance of tight BP control - monitor   4.   Mixed Cataract OU - The symptoms of cataract, surgical options, and treatments and risks were discussed with patient. - discussed diagnosis and progression - not yet visually significant - monitor for  now  Ophthalmic Meds  Ordered this visit:  Meds ordered this encounter  Medications   Bevacizumab (AVASTIN) SOLN 1.25 mg   Bevacizumab (AVASTIN) SOLN 1.25 mg     Return in about 4 weeks (around 05/14/2021) for DFE, OCT.  There are no Patient Instructions on file for this visit.  This document serves as a record of services personally performed by Gardiner Sleeper, MD, PhD. It was created on their behalf by [name], an ophthalmic technician. The creation of this record is the provider's dictation and/or activities during the visit.    Electronically signed by: [name] COA, 04/16/21  10:55 AM  This document serves as a record of services personally performed by Gardiner Sleeper, MD, PhD. It was created on their behalf by Roselee Nova, COMT. The creation of this record is the provider's dictation and/or activities during the visit.  Electronically signed by: Roselee Nova, COMT 04/16/21 10:55 AM  Gardiner Sleeper, M.D., Ph.D. Diseases & Surgery of the Retina and [place] 04/16/2021  I have reviewed the above documentation for accuracy and completeness, and I agree with the above. Gardiner Sleeper, M.D., Ph.D. 04/16/21 10:57 AM   Abbreviations: M myopia (nearsighted); A astigmatism; H hyperopia (farsighted); P presbyopia; Mrx spectacle prescription;  CTL contact lenses; OD right eye; OS left eye; OU both eyes  XT exotropia; ET esotropia; PEK punctate epithelial keratitis; PEE punctate epithelial erosions; DES dry eye syndrome; MGD meibomian gland dysfunction; ATs artificial tears; PFAT's preservative free artificial tears; Cisco nuclear sclerotic cataract; PSC posterior subcapsular cataract; ERM epi-retinal membrane; PVD posterior vitreous detachment; RD retinal detachment; DM diabetes mellitus; DR diabetic retinopathy; NPDR non-proliferative diabetic retinopathy; PDR proliferative diabetic retinopathy; CSME clinically significant macular edema; DME  diabetic macular edema; dbh dot blot hemorrhages; CWS cotton wool spot; POAG primary open angle glaucoma; C/D cup-to-disc ratio; HVF humphrey visual field; GVF goldmann visual field; OCT optical coherence tomography; IOP intraocular pressure; BRVO Branch retinal vein occlusion; CRVO central retinal vein occlusion; CRAO central retinal artery occlusion; BRAO branch retinal artery occlusion; RT retinal tear; SB scleral buckle; PPV pars plana vitrectomy; VH Vitreous hemorrhage; PRP panretinal laser photocoagulation; IVK intravitreal kenalog; VMT vitreomacular traction; MH Macular hole;  NVD neovascularization of the disc; NVE neovascularization elsewhere; AREDS age related eye disease study; ARMD age related macular degeneration; POAG primary open angle glaucoma; EBMD epithelial/anterior basement membrane dystrophy; ACIOL anterior chamber intraocular lens; IOL intraocular lens; PCIOL posterior chamber intraocular lens; Phaco/IOL phacoemulsification with intraocular lens placement; [place]chi photorefractive keratectomy; LASIK laser assisted in situ keratomileusis; HTN hypertension; DM diabetes mellitus; COPD chronic obstructive pulmonary disease

## 2021-04-16 ENCOUNTER — Ambulatory Visit (INDEPENDENT_AMBULATORY_CARE_PROVIDER_SITE_OTHER): Payer: BC Managed Care – PPO | Admitting: Ophthalmology

## 2021-04-16 ENCOUNTER — Encounter (INDEPENDENT_AMBULATORY_CARE_PROVIDER_SITE_OTHER): Payer: Self-pay | Admitting: Ophthalmology

## 2021-04-16 ENCOUNTER — Other Ambulatory Visit: Payer: Self-pay

## 2021-04-16 DIAGNOSIS — E113513 Type 2 diabetes mellitus with proliferative diabetic retinopathy with macular edema, bilateral: Secondary | ICD-10-CM

## 2021-04-16 DIAGNOSIS — I10 Essential (primary) hypertension: Secondary | ICD-10-CM

## 2021-04-16 DIAGNOSIS — H35033 Hypertensive retinopathy, bilateral: Secondary | ICD-10-CM

## 2021-04-16 DIAGNOSIS — H25813 Combined forms of age-related cataract, bilateral: Secondary | ICD-10-CM | POA: Diagnosis not present

## 2021-04-16 MED ORDER — BEVACIZUMAB CHEMO INJECTION 1.25MG/0.05ML SYRINGE FOR KALEIDOSCOPE
1.2500 mg | INTRAVITREAL | Status: AC | PRN
  Administered 2021-04-16: 1.25 mg via INTRAVITREAL

## 2021-05-14 ENCOUNTER — Encounter (INDEPENDENT_AMBULATORY_CARE_PROVIDER_SITE_OTHER): Payer: BC Managed Care – PPO | Admitting: Ophthalmology

## 2021-05-14 NOTE — Progress Notes (Signed)
[facility] Note  05/17/2021     CHIEF COMPLAINT Patient presents for Retina Follow Up    HISTORY OF PRESENT ILLNESS: Jason Bowen is a 61 y.o. male who presents to the clinic today for:    HPI     Retina Follow Up   Patient presents with  Diabetic Retinopathy.  In both eyes.  This started 4 weeks ago.  I, the attending physician,  performed the HPI with the patient and updated documentation appropriately.        Comments   Patient here for 4 weeks retina follow up for PDR OU. Patient states vision doing good. No eye pain.      Last edited by [name], MD on 05/19/2021 11:31 AM.    Pt states vision is stable  Referring physician: No referring provider defined for this encounter.  HISTORICAL INFORMATION:   Selected notes from the MEDICAL RECORD NUMBER Referred by [name] for eval of DR w/DME OS   CURRENT MEDICATIONS: No current outpatient medications on file. (Ophthalmic Drugs)   No current facility-administered medications for this visit. (Ophthalmic Drugs)   Current Outpatient Medications (Other)  Medication Sig   dapagliflozin propanediol (FARXIGA) 10 MG TABS tablet Take 10 mg by mouth daily.   Insulin Degludec (TRESIBA) 100 UNIT/ML SOLN Inject 36 Units into the skin daily.   insulin lispro (HUMALOG) 100 UNIT/ML injection Inject 15 Units into the skin 3 (three) times daily before meals.   lisinopril (ZESTRIL) 20 MG tablet Take 20 mg by mouth daily.   metFORMIN (GLUCOPHAGE) 500 MG tablet Take 500 mg by mouth 2 (two) times daily with a meal.   tirzepatide (MOUNJARO) 5 MG/0.5ML Pen Inject 5 mg into the skin once a week.   triamcinolone cream (KENALOG) 0.1 % SMARTSIG:1 Application Topical 2-3 Times Daily   lisinopril (ZESTRIL) 20 MG tablet 1 tablet (Patient not taking: Reported on 12/09/2020)   metFORMIN (GLUCOPHAGE-XR) 500 MG 24 hr tablet 1 tablet with evening meal (Patient not taking: Reported on 12/09/2020)   No current  facility-administered medications for this visit. (Other)   REVIEW OF SYSTEMS: ROS   Positive for: Endocrine, Eyes Negative for: Constitutional, Gastrointestinal, Neurological, Skin, Genitourinary, Musculoskeletal, HENT, Cardiovascular, Respiratory, Psychiatric, Allergic/Imm, Heme/Lymph Last edited by Theodore Demark, COA on 05/17/2021  2:38 PM.     ALLERGIES No Known Allergies  PAST MEDICAL HISTORY Past Medical History:  Diagnosis Date   Cancer ([place])    Cataract    Diabetic retinopathy ([place])    Hypertension    Hypertensive retinopathy    Past Surgical History:  Procedure Laterality Date   ACHILLES TENDON SURGERY     ANKLE SURGERY     CARPAL TUNNEL WITH CUBITAL TUNNEL     MOHS SURGERY     has had 20+ surgeries for this on different areas of body   FAMILY HISTORY Family History  Problem Relation Age of Onset   Hypertension Mother    Diabetes Mother    Cataracts Mother    Cancer Paternal Grandmother    SOCIAL HISTORY Social History   Tobacco Use   Smoking status: Never   Smokeless tobacco: Never  Vaping Use   Vaping Use: Never used  Substance Use Topics   Alcohol use: Not Currently       OPHTHALMIC EXAM: Base Eye Exam     Visual Acuity (Snellen - Linear)       Right Left   Dist cc 20/20 20/40 -1  Dist ph cc  20/30 +2    Correction: Glasses         Tonometry (Tonopen, 2:36 PM)       Right Left   Pressure 14 13         Pupils       Dark Light Shape React APD   Right 3 2 Round Brisk None   Left 3 2 Round Brisk None         Visual Fields (Counting fingers)       Left Right    Full Full         Extraocular Movement       Right Left    Full, Ortho Full, Ortho         Neuro/Psych     Oriented x3: Yes   Mood/Affect: Normal         Dilation     Both eyes: 1.0% Mydriacyl, 2.5% Phenylephrine @ 2:36 PM           Slit Lamp and Fundus Exam     Slit Lamp Exam       Right Left   Lids/Lashes Dermatochalasis - upper  lid Dermatochalasis - upper lid   Conjunctiva/Sclera White and quiet White and quiet   Cornea arcus arcus   Anterior Chamber deep, clear, narrow temporal angle deep, clear, narrow temporal angle   Iris Round and dilated, No NVI Round and dilated, No NVI   Lens Clear Trace Posterior subcapsular cataract   Anterior Vitreous mild syneresis mild syneresis         Fundus Exam       Right Left   Disc Pink and Sharp mild Pallor, Sharp rim   C/D Ratio 0.2 0.2   Macula Flat, blunted foveal reflex, +edema; scattered MA/DBH, boat shaped subhyalod heme IT to fovea -- very faint white, circinate exudates temporal macula - improving Blunted foveal reflex, circinate exudates and edema ST and temporal macula--improving, scattered MA/DBH   Vessels attenuated, mild tortuosity attenuated, mild tortuousity, scattered, early NV greatest superior to disc--regressing   Periphery Attached, 360 DBH, patches of NVE, good 360 PRP, room for fill in temporally and posteriorly if needed Attached, 360 DBH, patches of NVE regressing, good 360 PRP           Refraction     Wearing Rx       Sphere Cylinder Add   Right +1.75 Sphere +2.50   Left +1.75 Sphere +2.50           IMAGING AND PROCEDURES  Imaging and Procedures for 05/17/2021  OCT, Retina - OU - Both Eyes       Right Eye Quality was good. Central Foveal Thickness: 257. Progression has improved. Findings include abnormal foveal contour, intraretinal fluid, intraretinal hyper-reflective material, no SRF (interval improvement in IRF/IRHM temporal macula, interval improvement in subhyaloid hyper reflective material IT macula).   Left Eye Quality was good. Central Foveal Thickness: 243. Progression has improved. Findings include abnormal foveal contour, intraretinal fluid, intraretinal hyper-reflective material, no SRF, vitreomacular adhesion (Interval improvement in IRF/IRHM temporal macula ).   Notes *Images captured and stored on  drive  Diagnosis / Impression:  +DME OU OD: interval improvement in IRF/IRHM temporal macula, interval improvement in subhyaloid hyper reflective material IT macula OS: interval improvement in IRF/IRHM temporal macula   Clinical management:  See below  Abbreviations: NFP - Normal foveal profile. CME - cystoid macular edema. PED - pigment epithelial detachment. IRF - intraretinal fluid. SRF - subretinal  fluid. EZ - ellipsoid zone. ERM - epiretinal membrane. ORA - outer retinal atrophy. ORT - outer retinal tubulation. SRHM - subretinal hyper-reflective material. IRHM - intraretinal hyper-reflective material      Intravitreal Injection, Pharmacologic Agent - OD - Right Eye       Time Out 05/17/2021. 3:16 PM. Confirmed correct patient, procedure, site, and patient consented.   Anesthesia Topical anesthesia was used. Anesthetic medications included Lidocaine 2%, Proparacaine 0.5%.   Procedure Preparation included 5% betadine to ocular surface, eyelid speculum. A supplied needle was used.   Injection: 1.25 mg Bevacizumab 1.25mg /0.43ml   Route: Intravitreal, Site: Right Eye   NDC: H061816, Lot: [id number] , Expiration date: 06/23/2021   Post-op Post injection exam found visual acuity of at least counting fingers. The patient tolerated the procedure well. There were no complications. The patient received written and verbal post procedure care education. Post injection medications were not given.      Intravitreal Injection, Pharmacologic Agent - OS - Left Eye       Time Out 05/17/2021. 3:17 PM. Confirmed correct patient, procedure, site, and patient consented.   Anesthesia Topical anesthesia was used. Anesthetic medications included Lidocaine 2%, Proparacaine 0.5%.   Procedure Preparation included 5% betadine to ocular surface, eyelid speculum. A (32g) needle was used.   Injection: 1.25 mg Bevacizumab 1.25mg /0.72ml   Route: Intravitreal, Site: Left Eye   NDC:  80m, Lot: [id number], Expiration date: 06/07/2021   Post-op Post injection exam found visual acuity of at least counting fingers. The patient tolerated the procedure well. There were no complications. The patient received written and verbal post procedure care education. Post injection medications were not given.            ASSESSMENT/PLAN:   ICD-10-CM   1. Proliferative diabetic retinopathy of both eyes with macular edema associated with type 2 diabetes mellitus (HCC)  E11.3513 OCT, Retina - OU - Both Eyes    Intravitreal Injection, Pharmacologic Agent - OD - Right Eye    Intravitreal Injection, Pharmacologic Agent - OS - Left Eye    Bevacizumab (AVASTIN) SOLN 1.25 mg    Bevacizumab (AVASTIN) SOLN 1.25 mg    2. Essential hypertension  I10     3. Hypertensive retinopathy of both eyes  H35.033     4. Combined forms of age-related cataract of both eyes  H25.813      1. Proliferative diabetic retinopathy, both eyes  - s/p PRP OD (07.20.22)  - s/p PRP OS (08.03.22)  - s/p IVA OU #1 (08.17.22), #2 (09.14.22), #3 (10.12.22), #4 (11.09.22), #5 (12.21.22), #6 (01.20.23)  - history of poor BG control  - BCVA OD: 20/20--stable; OS: 20/30--improved - exam shows scattered IRH and NVE OU; OD with small subhyaloid heme -- white and fading - FA (07.12.22) shows scattered patches of NVE OU - OCT shows OD: interval improvement in IRF/IRHM temporal macula, interval improvement in subhyaloid hyper reflective material IT macula;OS: interval improvement in IRF/IRHM temporal macula  - recommend IVA OU #7 today, 02.20.23 for DME - pt wishes to proceed with injections - RBA of procedure discussed, questions answered - informed consent obtained and signed - see procedure note - f/u in 4 wks -- DFE/OCT, possible injection(s) v. focal laser  2,3. Hypertensive retinopathy OU - discussed importance of tight BP control - monitor  4.   Mixed Cataract OU - The symptoms of cataract, surgical  options, and treatments and risks were discussed with patient. - discussed diagnosis and progression - not yet visually significant -  monitor for now  Ophthalmic Meds Ordered this visit:  Meds ordered this encounter  Medications   Bevacizumab (AVASTIN) SOLN 1.25 mg   Bevacizumab (AVASTIN) SOLN 1.25 mg     Return in about 4 weeks (around 06/14/2021) for f/u PDR OU, DFE, OCT.  There are no Patient Instructions on file for this visit.  This document serves as a record of services personally performed by Gardiner Sleeper, MD, PhD. It was created on their behalf by Roselee Nova, COMT. The creation of this record is the provider's dictation and/or activities during the visit.  Electronically signed by: Roselee Nova, COMT 05/19/21 11:37 AM  This document serves as a record of services personally performed by Gardiner Sleeper, MD, PhD. It was created on their behalf by San Jetty. Owens Shark, OA an ophthalmic technician. The creation of this record is the provider's dictation and/or activities during the visit.    Electronically signed by: San Jetty. Owens Shark, [place] 02.20.2023 11:37 AM  Gardiner Sleeper, M.D., Ph.D. Diseases & Surgery of the Retina and Vitreous [facility]  I have reviewed the above documentation for accuracy and completeness, and I agree with the above. Gardiner Sleeper, M.D., Ph.D. 05/19/21 11:37 AM   Abbreviations: M myopia (nearsighted); A astigmatism; H hyperopia (farsighted); P presbyopia; Mrx spectacle prescription;  CTL contact lenses; OD right eye; OS left eye; OU both eyes  XT exotropia; ET esotropia; PEK punctate epithelial keratitis; PEE punctate epithelial erosions; DES dry eye syndrome; MGD meibomian gland dysfunction; ATs artificial tears; PFAT's preservative free artificial tears; [place] nuclear sclerotic cataract; PSC posterior subcapsular cataract; ERM epi-retinal membrane; PVD posterior vitreous detachment; RD retinal detachment; DM diabetes mellitus; DR  diabetic retinopathy; NPDR non-proliferative diabetic retinopathy; PDR proliferative diabetic retinopathy; CSME clinically significant macular edema; DME diabetic macular edema; dbh dot blot hemorrhages; CWS cotton wool spot; POAG primary open angle glaucoma; C/D cup-to-disc ratio; HVF humphrey visual field; GVF goldmann visual field; OCT optical coherence tomography; IOP intraocular pressure; BRVO Branch retinal vein occlusion; CRVO central retinal vein occlusion; CRAO central retinal artery occlusion; BRAO branch retinal artery occlusion; RT retinal tear; SB scleral buckle; PPV pars plana vitrectomy; VH Vitreous hemorrhage; PRP panretinal laser photocoagulation; IVK intravitreal kenalog; VMT vitreomacular traction; MH Macular hole;  NVD neovascularization of the disc; NVE neovascularization elsewhere; AREDS age related eye disease study; ARMD age related macular degeneration; POAG primary open angle glaucoma; EBMD epithelial/anterior basement membrane dystrophy; ACIOL anterior chamber intraocular lens; IOL intraocular lens; PCIOL posterior chamber intraocular lens; Phaco/IOL phacoemulsification with intraocular lens placement; [place] photorefractive keratectomy; LASIK laser assisted in situ keratomileusis; HTN hypertension; DM diabetes mellitus; COPD chronic obstructive pulmonary disease

## 2021-05-17 ENCOUNTER — Ambulatory Visit (INDEPENDENT_AMBULATORY_CARE_PROVIDER_SITE_OTHER): Payer: BC Managed Care – PPO | Admitting: Ophthalmology

## 2021-05-17 ENCOUNTER — Other Ambulatory Visit: Payer: Self-pay

## 2021-05-17 ENCOUNTER — Encounter (INDEPENDENT_AMBULATORY_CARE_PROVIDER_SITE_OTHER): Payer: Self-pay | Admitting: Ophthalmology

## 2021-05-17 DIAGNOSIS — H25813 Combined forms of age-related cataract, bilateral: Secondary | ICD-10-CM | POA: Diagnosis not present

## 2021-05-17 DIAGNOSIS — I10 Essential (primary) hypertension: Secondary | ICD-10-CM

## 2021-05-17 DIAGNOSIS — E113513 Type 2 diabetes mellitus with proliferative diabetic retinopathy with macular edema, bilateral: Secondary | ICD-10-CM

## 2021-05-17 DIAGNOSIS — H35033 Hypertensive retinopathy, bilateral: Secondary | ICD-10-CM

## 2021-05-19 ENCOUNTER — Encounter (INDEPENDENT_AMBULATORY_CARE_PROVIDER_SITE_OTHER): Payer: Self-pay | Admitting: Ophthalmology

## 2021-05-19 MED ORDER — BEVACIZUMAB CHEMO INJECTION 1.25MG/0.05ML SYRINGE FOR KALEIDOSCOPE
1.2500 mg | INTRAVITREAL | Status: AC | PRN
  Administered 2021-05-19: 1.25 mg via INTRAVITREAL

## 2021-05-20 DIAGNOSIS — Z794 Long term (current) use of insulin: Secondary | ICD-10-CM | POA: Diagnosis not present

## 2021-05-20 DIAGNOSIS — E113213 Type 2 diabetes mellitus with mild nonproliferative diabetic retinopathy with macular edema, bilateral: Secondary | ICD-10-CM | POA: Diagnosis not present

## 2021-05-20 DIAGNOSIS — E1141 Type 2 diabetes mellitus with diabetic mononeuropathy: Secondary | ICD-10-CM | POA: Diagnosis not present

## 2021-05-20 DIAGNOSIS — E1169 Type 2 diabetes mellitus with other specified complication: Secondary | ICD-10-CM | POA: Diagnosis not present

## 2021-06-18 NOTE — Progress Notes (Shared)
?[facility] Note ? ?06/21/2021 ? ?  ? ?CHIEF COMPLAINT ?Patient presents for No chief complaint on file. ? ? ? ?HISTORY OF PRESENT ILLNESS: ?Jason Bowen is a 61 y.o. male who presents to the clinic today for:  ? ? ? ?Pt states vision is stable ? ?Referring physician: ?No referring provider defined for this encounter. ? ?HISTORICAL INFORMATION:  ? ?Selected notes from the [place] ?Referred by [name] for eval of DR w/DME OS  ? ?CURRENT MEDICATIONS: ?No current outpatient medications on file. (Ophthalmic Drugs)  ? ?No current facility-administered medications for this visit. (Ophthalmic Drugs)  ? ?Current Outpatient Medications (Other)  ?Medication Sig  ? dapagliflozin propanediol (FARXIGA) 10 MG TABS tablet Take 10 mg by mouth daily.  ? Insulin Degludec (TRESIBA) 100 UNIT/ML SOLN Inject 36 Units into the skin daily.  ? insulin lispro (HUMALOG) 100 UNIT/ML injection Inject 15 Units into the skin 3 (three) times daily before meals.  ? lisinopril (ZESTRIL) 20 MG tablet Take 20 mg by mouth daily.  ? lisinopril (ZESTRIL) 20 MG tablet 1 tablet (Patient not taking: Reported on 12/09/2020)  ? metFORMIN (GLUCOPHAGE) 500 MG tablet Take 500 mg by mouth 2 (two) times daily with a meal.  ? metFORMIN (GLUCOPHAGE-XR) 500 MG 24 hr tablet 1 tablet with evening meal (Patient not taking: Reported on 12/09/2020)  ? tirzepatide [facility]) 5 MG/0.5ML Pen Inject 5 mg into the skin once a week.  ? triamcinolone cream (KENALOG) 0.1 % SMARTSIG:1 Application Topical 2-3 Times Daily  ? ?No current facility-administered medications for this visit. (Other)  ? ?REVIEW OF SYSTEMS: ? ? ?ALLERGIES ?No Known Allergies ? ?PAST MEDICAL HISTORY ?Past Medical History:  ?Diagnosis Date  ? Cancer [facility])   ? Cataract   ? Diabetic retinopathy ([place])   ? Hypertension   ? Hypertensive retinopathy   ? ?Past Surgical History:  ?Procedure Laterality Date  ? ACHILLES TENDON SURGERY    ? ANKLE SURGERY    ? CARPAL TUNNEL WITH  CUBITAL TUNNEL    ? MOHS SURGERY    ? has had 20+ surgeries for this on different areas of body  ? ?FAMILY HISTORY ?Family History  ?Problem Relation Age of Onset  ? Hypertension Mother   ? Diabetes Mother   ? Cataracts Mother   ? Cancer Paternal Grandmother   ? ?SOCIAL HISTORY ?Social History  ? ?Tobacco Use  ? Smoking status: Never  ? Smokeless tobacco: Never  ?Vaping Use  ? Vaping Use: Never used  ?Substance Use Topics  ? Alcohol use: Not Currently  ?  ? ?  ?OPHTHALMIC EXAM: ?Not recorded ?  ? ?IMAGING AND PROCEDURES  ?Imaging and Procedures for 06/21/2021 ? ? ?  ?  ? ?  ?ASSESSMENT/PLAN: ?  ICD-10-CM   ?1. Proliferative diabetic retinopathy of both eyes with macular edema associated with type 2 diabetes mellitus ([place])  G92.1194   ?  ?2. Essential hypertension  I10   ?  ?3. Hypertensive retinopathy of both eyes  H35.033   ?  ?4. Combined forms of age-related cataract of both eyes  H25.813   ?  ? ? ?1. Proliferative diabetic retinopathy, both eyes ? - s/p PRP OD (07.20.22) ? - s/p PRP OS (08.03.22) ? - s/p IVA OU #1 (08.17.22), #2 (09.14.22), #3 (10.12.22), #4 (11.09.22), #5 (12.21.22), #6 (01.20.23), #7 (02.20.23) ? - history of poor BG control ? - BCVA OD: 20/20--stable; OS: 20/30--improved ?- exam shows scattered [place] and NVE OU;  OD with small subhyaloid heme -- white and fading ?- FA (07.12.22) shows scattered patches of NVE OU ?- OCT shows OD: interval improvement in IRF/IRHM temporal macula, interval improvement in subhyaloid hyper reflective material IT macula;OS: interval improvement in IRF/IRHM temporal macula  ?- recommend IVA OU #8 today, 03.27.23 for DME ?- pt wishes to proceed with injections ?- RBA of procedure discussed, questions answered ?- informed consent obtained and signed ?- see procedure note ?- f/u in 4 wks -- DFE/OCT, possible injection(s) v. focal laser ? ?2,3. Hypertensive retinopathy OU ?- discussed importance of tight BP control ?- monitor ? ?4.   Mixed Cataract OU ?- The symptoms of  cataract, surgical options, and treatments and risks were discussed with patient. ?- discussed diagnosis and progression ?- not yet visually significant ?- monitor for now ? ?Ophthalmic Meds Ordered this visit:  ?No orders of the defined types were placed in this encounter. ?  ? ?No follow-ups on file. ? ?There are no Patient Instructions on file for this visit. ? ?This document serves as a record of services personally performed by Gardiner Sleeper, MD, PhD. It was created on their behalf by Roselee Nova, COMT. The creation of this record is the provider's dictation and/or activities during the visit. ? ?Electronically signed by: Roselee Nova, COMT 06/18/21 2:28 PM ? ? ?Gardiner Sleeper, M.D., Ph.D. ?Diseases & Surgery of the Retina and Vitreous ?[place] ? ? ? ? ?Abbreviations: ?M myopia (nearsighted); A astigmatism; H hyperopia (farsighted); P presbyopia; Mrx spectacle prescription;  CTL contact lenses; OD right eye; OS left eye; OU both eyes  XT exotropia; ET esotropia; PEK punctate epithelial keratitis; PEE punctate epithelial erosions; DES dry eye syndrome; MGD meibomian gland dysfunction; ATs artificial tears; PFAT's preservative free artificial tears; [place] nuclear sclerotic cataract; PSC posterior subcapsular cataract; ERM epi-retinal membrane; PVD posterior vitreous detachment; RD retinal detachment; DM diabetes mellitus; DR diabetic retinopathy; NPDR non-proliferative diabetic retinopathy; PDR proliferative diabetic retinopathy; CSME clinically significant macular edema; DME diabetic macular edema; dbh dot blot hemorrhages; CWS cotton wool spot; POAG primary open angle glaucoma; C/D cup-to-disc ratio; HVF humphrey visual field; GVF goldmann visual field; OCT optical coherence tomography; IOP intraocular pressure; BRVO Branch retinal vein occlusion; CRVO central retinal vein occlusion; CRAO central retinal artery occlusion; BRAO branch retinal artery occlusion; RT retinal tear; SB  scleral buckle; PPV pars plana vitrectomy; VH Vitreous hemorrhage; PRP panretinal laser photocoagulation; IVK intravitreal kenalog; VMT vitreomacular traction; MH Macular hole;  NVD neovascularization of the disc; NVE neovascularization elsewhere; AREDS age related eye disease study; ARMD age related macular degeneration; POAG primary open angle glaucoma; EBMD epithelial/anterior basement membrane dystrophy; ACIOL anterior chamber intraocular lens; IOL intraocular lens; PCIOL posterior chamber intraocular lens; Phaco/IOL phacoemulsification with intraocular lens placement; [place] photorefractive keratectomy; LASIK laser assisted in situ keratomileusis; HTN hypertension; DM diabetes mellitus; COPD chronic obstructive pulmonary disease  ?

## 2021-06-21 ENCOUNTER — Encounter (INDEPENDENT_AMBULATORY_CARE_PROVIDER_SITE_OTHER): Payer: BC Managed Care – PPO | Admitting: Ophthalmology

## 2021-06-21 DIAGNOSIS — H25813 Combined forms of age-related cataract, bilateral: Secondary | ICD-10-CM

## 2021-06-21 DIAGNOSIS — I10 Essential (primary) hypertension: Secondary | ICD-10-CM

## 2021-06-21 DIAGNOSIS — E113513 Type 2 diabetes mellitus with proliferative diabetic retinopathy with macular edema, bilateral: Secondary | ICD-10-CM

## 2021-06-21 DIAGNOSIS — H35033 Hypertensive retinopathy, bilateral: Secondary | ICD-10-CM

## 2021-06-23 ENCOUNTER — Ambulatory Visit (INDEPENDENT_AMBULATORY_CARE_PROVIDER_SITE_OTHER): Payer: BC Managed Care – PPO | Admitting: Ophthalmology

## 2021-06-23 ENCOUNTER — Other Ambulatory Visit: Payer: Self-pay

## 2021-06-23 ENCOUNTER — Encounter (INDEPENDENT_AMBULATORY_CARE_PROVIDER_SITE_OTHER): Payer: Self-pay | Admitting: Ophthalmology

## 2021-06-23 DIAGNOSIS — H25813 Combined forms of age-related cataract, bilateral: Secondary | ICD-10-CM | POA: Diagnosis not present

## 2021-06-23 DIAGNOSIS — I10 Essential (primary) hypertension: Secondary | ICD-10-CM | POA: Diagnosis not present

## 2021-06-23 DIAGNOSIS — H35033 Hypertensive retinopathy, bilateral: Secondary | ICD-10-CM

## 2021-06-23 DIAGNOSIS — E113513 Type 2 diabetes mellitus with proliferative diabetic retinopathy with macular edema, bilateral: Secondary | ICD-10-CM

## 2021-06-23 MED ORDER — BEVACIZUMAB CHEMO INJECTION 1.25MG/0.05ML SYRINGE FOR KALEIDOSCOPE
1.2500 mg | INTRAVITREAL | Status: AC | PRN
  Administered 2021-06-23: 1.25 mg via INTRAVITREAL

## 2021-06-23 NOTE — Progress Notes (Signed)
?[facility] Note ? ?06/23/2021 ? ?  ? ?CHIEF COMPLAINT ?Patient presents for Retina Follow Up ? ? ? ?HISTORY OF PRESENT ILLNESS: ?Jason Bowen is a 61 y.o. male who presents to the clinic today for:  ? ? ?HPI   ? ? Retina Follow Up   ?Patient presents with  Diabetic Retinopathy.  In both eyes.  This started 4 weeks ago.  I, the attending physician,  performed the HPI with the patient and updated documentation appropriately. ? ?  ?  ? ? Comments   ?Patient here for 4 weeks retina follow up for PDR OU. Patient states vision doing good. No eye pain. AIC 6.5 and lost 30 lbs since started taking mounjaro. ? ?  ?  ?Last edited by [name], MD on 06/23/2021 12:50 PM.  ?  ?Pt states vision is stable, A1c is 6.5 ? ?Referring physician: ?No referring provider defined for this encounter. ? ?HISTORICAL INFORMATION:  ? ?Selected notes from the [facility] ?Referred by [name] for eval of DR w/DME OS  ? ?CURRENT MEDICATIONS: ?No current outpatient medications on file. (Ophthalmic Drugs)  ? ?No current facility-administered medications for this visit. (Ophthalmic Drugs)  ? ?Current Outpatient Medications (Other)  ?Medication Sig  ? dapagliflozin propanediol (FARXIGA) 10 MG TABS tablet Take 10 mg by mouth daily.  ? Insulin Degludec (TRESIBA) 100 UNIT/ML SOLN Inject 36 Units into the skin daily.  ? insulin lispro (HUMALOG) 100 UNIT/ML injection Inject 15 Units into the skin 3 (three) times daily before meals.  ? lisinopril (ZESTRIL) 20 MG tablet Take 20 mg by mouth daily.  ? metFORMIN (GLUCOPHAGE) 500 MG tablet Take 500 mg by mouth 2 (two) times daily with a meal.  ? tirzepatide (MOUNJARO) 5 MG/0.5ML Pen Inject 5 mg into the skin once a week.  ? triamcinolone cream (KENALOG) 0.1 % SMARTSIG:1 Application Topical 2-3 Times Daily  ? lisinopril (ZESTRIL) 20 MG tablet 1 tablet (Patient not taking: Reported on 12/09/2020)  ? metFORMIN (GLUCOPHAGE-XR) 500 MG 24 hr tablet 1 tablet with evening  meal (Patient not taking: Reported on 12/09/2020)  ? ?No current facility-administered medications for this visit. (Other)  ? ?REVIEW OF SYSTEMS: ?ROS   ?Positive for: Endocrine, Eyes ?Negative for: Constitutional, Gastrointestinal, Neurological, Skin, Genitourinary, Musculoskeletal, HENT, Cardiovascular, Respiratory, Psychiatric, Allergic/Imm, Heme/Lymph ?Last edited by Theodore Demark, COA on 06/23/2021  9:20 AM.  ?  ? ?ALLERGIES ?No Known Allergies ? ?PAST MEDICAL HISTORY ?Past Medical History:  ?Diagnosis Date  ? Cancer [facility])   ? Cataract   ? Diabetic retinopathy ([place])   ? Hypertension   ? Hypertensive retinopathy   ? ?Past Surgical History:  ?Procedure Laterality Date  ? ACHILLES TENDON SURGERY    ? ANKLE SURGERY    ? CARPAL TUNNEL WITH CUBITAL TUNNEL    ? MOHS SURGERY    ? has had 20+ surgeries for this on different areas of body  ? ?FAMILY HISTORY ?Family History  ?Problem Relation Age of Onset  ? Hypertension Mother   ? Diabetes Mother   ? Cataracts Mother   ? Cancer Paternal Grandmother   ? ?SOCIAL HISTORY ?Social History  ? ?Tobacco Use  ? Smoking status: Never  ? Smokeless tobacco: Never  ?Vaping Use  ? Vaping Use: Never used  ?Substance Use Topics  ? Alcohol use: Not Currently  ?  ? ?  ?OPHTHALMIC EXAM: ?Base Eye Exam   ? ? Visual Acuity (Snellen - Linear)   ? ?  Right Left  ? Dist cc 20/20 20/40 +2  ? Dist ph cc  20/25 -2  ? ? Correction: Glasses  ? ?  ?  ? ? Tonometry (Tonopen, 9:18 AM)   ? ?   Right Left  ? Pressure 15 15  ? ?  ?  ? ? Pupils   ? ?   Dark Light Shape React APD  ? Right 3 2 Round Brisk None  ? Left 3 2 Round Brisk None  ? ?  ?  ? ? Visual Fields (Counting fingers)   ? ?   Left Right  ?  Full Full  ? ?  ?  ? ? Extraocular Movement   ? ?   Right Left  ?  Full, Ortho Full, Ortho  ? ?  ?  ? ? Neuro/Psych   ? ? Oriented x3: Yes  ? Mood/Affect: Normal  ? ?  ?  ? ? Dilation   ? ? Both eyes: 1.0% Mydriacyl, 2.5% Phenylephrine @ 9:18 AM  ? ?  ?  ? ?  ? ?Slit Lamp and Fundus Exam   ? ? Slit Lamp  Exam   ? ?   Right Left  ? Lids/Lashes Dermatochalasis - upper lid Dermatochalasis - upper lid  ? Conjunctiva/Sclera White and quiet White and quiet  ? Cornea arcus arcus  ? Anterior Chamber deep, clear, narrow temporal angle deep, clear, narrow temporal angle  ? Iris Round and dilated, No NVI Round and dilated, No NVI  ? Lens Clear Trace Posterior subcapsular cataract  ? Anterior Vitreous mild syneresis mild syneresis  ? ?  ?  ? ? Fundus Exam   ? ?   Right Left  ? Disc Pink and Sharp mild Pallor, Sharp rim  ? C/D Ratio 0.2 0.2  ? Macula Flat, blunted foveal reflex, +edema; scattered MA/DBH, boat shaped subhyalod heme IT to fovea -- almost resolved, circinate exudates temporal macula Blunted foveal reflex, circinate exudates and edema ST and temporal macula, scattered MA/DBH  ? Vessels attenuated, mild tortuosity attenuated, mild tortuousity, scattered, early NV greatest superior to disc--regressing  ? Periphery Attached, 360 DBH, patches of NVE, good 360 PRP, room for fill in temporally and posteriorly if needed Attached, 360 DBH, patches of NVE regressing, good 360 PRP  ? ?  ?  ? ?  ? ?Refraction   ? ? Wearing Rx   ? ?   Sphere Cylinder Add  ? Right +1.75 Sphere +2.50  ? Left +1.75 Sphere +2.50  ? ?  ?  ? ?  ? ?IMAGING AND PROCEDURES  ?Imaging and Procedures for 06/23/2021 ? ?OCT, Retina - OU - Both Eyes   ? ?   ?Right Eye ?Quality was good. Central Foveal Thickness: 254. Progression has worsened. Findings include abnormal foveal contour, intraretinal fluid, intraretinal hyper-reflective material, no SRF (Mild interval increase in IRF/IRHM temporal macula, interval improvement in subhyaloid hyper reflective material IT macula).  ? ?Left Eye ?Quality was good. Central Foveal Thickness: 238. Progression has worsened. Findings include abnormal foveal contour, intraretinal fluid, intraretinal hyper-reflective material, no SRF, vitreomacular adhesion (Mild Interval increase in IRF/IRHM temporal macula ).   ? ?Notes ?*Images captured and stored on drive ? ?Diagnosis / Impression:  ?+DME OU ?OD: Mild interval increase in IRF/IRHM temporal macula, interval improvement in subhyaloid hyper reflective material IT macula ?OS: Mild Interval increase in  IRF/IRHM temporal macula  ? ?Clinical management:  ?See below ? ?Abbreviations: NFP - Normal foveal profile. CME - cystoid macular  edema. PED - pigment epithelial detachment. IRF - intraretinal fluid. SRF - subretinal fluid. EZ - ellipsoid zone. ERM - epiretinal membrane. ORA - outer retinal atrophy. ORT - outer retinal tubulation. SRHM - subretinal hyper-reflective material. IRHM - intraretinal hyper-reflective material ? ? ?  ? ?Intravitreal Injection, Pharmacologic Agent - OD - Right Eye   ? ?   ?Time Out ?06/23/2021. 9:56 AM. Confirmed correct patient, procedure, site, and patient consented.  ? ?Anesthesia ?Topical anesthesia was used. Anesthetic medications included Lidocaine 2%, Proparacaine 0.5%.  ? ?Procedure ?Preparation included 5% betadine to ocular surface, eyelid speculum. A supplied needle was used.  ? ?Injection: ?1.25 mg Bevacizumab 1.'25mg'$ /0.106m ?  Route: Intravitreal, Site: Right Eye ?  NDC: [id number] Lot: [id number] , Expiration date: 08/04/2021  ? ?Post-op ?Post injection exam found visual acuity of at least counting fingers. The patient tolerated the procedure well. There were no complications. The patient received written and verbal post procedure care education. Post injection medications were not given.  ? ?  ? ?Intravitreal Injection, Pharmacologic Agent - OS - Left Eye   ? ?   ?Time Out ?06/23/2021. 9:57 AM. Confirmed correct patient, procedure, site, and patient consented.  ? ?Anesthesia ?Topical anesthesia was used. Anesthetic medications included Lidocaine 2%, Proparacaine 0.5%.  ? ?Procedure ?Preparation included 5% betadine to ocular surface, eyelid speculum. A (32g) needle was used.  ? ?Injection: ?1.25 mg Bevacizumab 1.'25mg'$ /0.072m?  Route:  Intravitreal, Site: Left Eye ?  [id number]Lot: [id number] date: 08/04/2021  ? ?Post-op ?Post injection exam found visual acuity of at least counting fingers. The patient tolerated the procedure well. T

## 2021-07-15 NOTE — Progress Notes (Signed)
?[facility] Note ? ?07/21/2021 ? ?  ? ?CHIEF COMPLAINT ?Patient presents for Retina Follow Up ? ? ? ?HISTORY OF PRESENT ILLNESS: ?Jason Bowen is a 61 y.o. male who presents to the clinic today for:  ? ? ?HPI   ? ? Retina Follow Up   ?Patient presents with  Diabetic Retinopathy.  In both eyes.  This started 4 weeks ago.  I, the attending physician,  performed the HPI with the patient and updated documentation appropriately. ? ?  ?  ? ? Comments   ?Patient here for 4 weeks retina follow up for PDR OU. Patient states vision doing good. No eye pain.  ? ?  ?  ?Last edited by [name], MD on 07/21/2021 11:19 PM.  ?  ? ? ?Referring physician: ?No referring provider defined for this encounter. ? ?HISTORICAL INFORMATION:  ? ?Selected notes from the [facility] ?Referred by [name] for eval of DR w/DME OS  ? ?CURRENT MEDICATIONS: ?No current outpatient medications on file. (Ophthalmic Drugs)  ? ?No current facility-administered medications for this visit. (Ophthalmic Drugs)  ? ?Current Outpatient Medications (Other)  ?Medication Sig  ? dapagliflozin propanediol (FARXIGA) 10 MG TABS tablet Take 10 mg by mouth daily.  ? Insulin Degludec (TRESIBA) 100 UNIT/ML SOLN Inject 36 Units into the skin daily.  ? insulin lispro (HUMALOG) 100 UNIT/ML injection Inject 15 Units into the skin 3 (three) times daily before meals.  ? lisinopril (ZESTRIL) 20 MG tablet Take 20 mg by mouth daily.  ? metFORMIN (GLUCOPHAGE) 500 MG tablet Take 500 mg by mouth 2 (two) times daily with a meal.  ? tirzepatide (MOUNJARO) 5 MG/0.5ML Pen Inject 5 mg into the skin once a week.  ? triamcinolone cream (KENALOG) 0.1 % SMARTSIG:1 Application Topical 2-3 Times Daily  ? lisinopril (ZESTRIL) 20 MG tablet 1 tablet (Patient not taking: Reported on 12/09/2020)  ? metFORMIN (GLUCOPHAGE-XR) 500 MG 24 hr tablet 1 tablet with evening meal (Patient not taking: Reported on 12/09/2020)  ? ?No current facility-administered  medications for this visit. (Other)  ? ?REVIEW OF SYSTEMS: ?ROS   ?Positive for: Endocrine, Eyes ?Negative for: Constitutional, Gastrointestinal, Neurological, Skin, Genitourinary, Musculoskeletal, HENT, Cardiovascular, Respiratory, Psychiatric, Allergic/Imm, Heme/Lymph ?Last edited by Theodore Demark, COA on 07/21/2021  9:11 AM.  ?  ? ?ALLERGIES ?No Known Allergies ? ?PAST MEDICAL HISTORY ?Past Medical History:  ?Diagnosis Date  ? Cancer [facility])   ? Cataract   ? Diabetic retinopathy ([place])   ? Hypertension   ? Hypertensive retinopathy   ? ?Past Surgical History:  ?Procedure Laterality Date  ? ACHILLES TENDON SURGERY    ? ANKLE SURGERY    ? CARPAL TUNNEL WITH CUBITAL TUNNEL    ? MOHS SURGERY    ? has had 20+ surgeries for this on different areas of body  ? ?FAMILY HISTORY ?Family History  ?Problem Relation Age of Onset  ? Hypertension Mother   ? Diabetes Mother   ? Cataracts Mother   ? Cancer Paternal Grandmother   ? ?SOCIAL HISTORY ?Social History  ? ?Tobacco Use  ? Smoking status: Never  ? Smokeless tobacco: Never  ?Vaping Use  ? Vaping Use: Never used  ?Substance Use Topics  ? Alcohol use: Not Currently  ?  ? ?  ?OPHTHALMIC EXAM: ?Base Eye Exam   ? ? Visual Acuity (Snellen - Linear)   ? ?   Right Left  ? Dist cc 20/20 -1 20/30 -2  ? Dist  ph cc  20/25 -2  ? ?  ?  ? ? Tonometry (Tonopen, 9:10 AM)   ? ?   Right Left  ? Pressure 14 15  ? ?  ?  ? ? Pupils   ? ?   Dark Light Shape React APD  ? Right 3 2 Round Brisk None  ? Left 3 2 Round Brisk None  ? ?  ?  ? ? Visual Fields (Counting fingers)   ? ?   Left Right  ?  Full Full  ? ?  ?  ? ? Extraocular Movement   ? ?   Right Left  ?  Full, Ortho Full, Ortho  ? ?  ?  ? ? Neuro/Psych   ? ? Oriented x3: Yes  ? Mood/Affect: Normal  ? ?  ?  ? ? Dilation   ? ? Both eyes: 1.0% Mydriacyl, 2.5% Phenylephrine @ 9:09 AM  ? ?  ?  ? ?  ? ?Slit Lamp and Fundus Exam   ? ? Slit Lamp Exam   ? ?   Right Left  ? Lids/Lashes Dermatochalasis - upper lid Dermatochalasis - upper lid  ?  Conjunctiva/Sclera White and quiet White and quiet  ? Cornea arcus arcus  ? Anterior Chamber deep, clear, narrow temporal angle deep, clear, narrow temporal angle  ? Iris Round and dilated, No NVI Round and dilated, No NVI  ? Lens Clear Trace Posterior subcapsular cataract  ? Anterior Vitreous mild syneresis mild syneresis  ? ?  ?  ? ? Fundus Exam   ? ?   Right Left  ? Disc Pink and Sharp mild Pallor, Sharp rim  ? C/D Ratio 0.2 0.2  ? Macula Flat, blunted foveal reflex, +edema; focal MA/DBH with exudates ST to fovea, boat shaped subhyalod heme IT to fovea -- almost resolved Blunted foveal reflex, circinate exudates and edema temporal macula, scattered MA/DBH  ? Vessels attenuated, mild tortuosity attenuated, mild tortuousity, scattered, early NV greatest superior to disc--regressing  ? Periphery Attached, 360 DBH, patches of NVE, good 360 PRP, room for fill in temporally and posteriorly if needed Attached, 360 DBH, patches of NVE regressing, good 360 PRP  ? ?  ?  ? ?  ? ?Refraction   ? ? Wearing Rx   ? ?   Sphere Cylinder Add  ? Right +1.75 Sphere +2.50  ? Left +1.75 Sphere +2.50  ? ?  ?  ? ?  ? ?IMAGING AND PROCEDURES  ?Imaging and Procedures for 07/21/2021 ? ?OCT, Retina - OU - Both Eyes   ? ?   ?Right Eye ?Quality was good. Central Foveal Thickness: 253. Progression has been stable. Findings include abnormal foveal contour, intraretinal fluid, intraretinal hyper-reflective material, no SRF (Persistent IRF/IRHM temporal macula).  ? ?Left Eye ?Quality was good. Central Foveal Thickness: 240. Progression has been stable. Findings include abnormal foveal contour, intraretinal fluid, intraretinal hyper-reflective material, no SRF, vitreomacular adhesion (persistent IRF/IRHM temporal macula -- ?slight improvement).  ? ?Notes ?*Images captured and stored on drive ? ?Diagnosis / Impression:  ?+DME OU ?OD: Persistent IRF/IRHM temporal macula ?OS:  persistent IRF/IRHM temporal macula -- ?slight improvement ? ?Clinical  management:  ?See below ? ?Abbreviations: NFP - Normal foveal profile. CME - cystoid macular edema. PED - pigment epithelial detachment. IRF - intraretinal fluid. SRF - subretinal fluid. EZ - ellipsoid zone. ERM - epiretinal membrane. ORA - outer retinal atrophy. ORT - outer retinal tubulation. SRHM - subretinal hyper-reflective material. IRHM - intraretinal hyper-reflective material ? ? ?  ? ?  Intravitreal Injection, Pharmacologic Agent - OD - Right Eye   ? ?   ?Time Out ?07/21/2021. 9:49 AM. Confirmed correct patient, procedure, site, and patient consented.  ? ?Anesthesia ?Topical anesthesia was used. Anesthetic medications included Lidocaine 2%, Proparacaine 0.5%.  ? ?Procedure ?Preparation included 5% betadine to ocular surface, eyelid speculum. A supplied needle was used.  ? ?Injection: ?1.25 mg Bevacizumab 1.'25mg'$ /0.63m ?  Route: Intravitreal, Site: Right Eye ?  NDC: [id number] Lot: [id number] , Expiration date: 09/08/2021  ? ?Post-op ?Post injection exam found visual acuity of at least counting fingers. The patient tolerated the procedure well. There were no complications. The patient received written and verbal post procedure care education. Post injection medications were not given.  ? ?  ? ?Intravitreal Injection, Pharmacologic Agent - OS - Left Eye   ? ?   ?Time Out ?07/21/2021. 9:50 AM. Confirmed correct patient, procedure, site, and patient consented.  ? ?Anesthesia ?Topical anesthesia was used. Anesthetic medications included Lidocaine 2%, Proparacaine 0.5%.  ? ?Procedure ?Preparation included 5% betadine to ocular surface, eyelid speculum. A (32g) needle was used.  ? ?Injection: ?1.25 mg Bevacizumab 1.'25mg'$ /0.043m?  Route: Intravitreal, Site: Left Eye ?  [id number]Lot: [id number] date: 09/20/2021  ? ?Post-op ?Post injection exam found visual acuity of at least counting fingers. The patient tolerated the procedure well. There were no complications. The patient received written and verbal  post procedure care education. Post injection medications were not given.  ? ?  ?  ?  ? ?  ?ASSESSMENT/PLAN: ?  ICD-10-CM   ?1. Proliferative diabetic retinopathy of both eyes with macular edema associated with type 2 diab

## 2021-07-21 ENCOUNTER — Ambulatory Visit (INDEPENDENT_AMBULATORY_CARE_PROVIDER_SITE_OTHER): Payer: BC Managed Care – PPO | Admitting: Ophthalmology

## 2021-07-21 ENCOUNTER — Encounter (INDEPENDENT_AMBULATORY_CARE_PROVIDER_SITE_OTHER): Payer: Self-pay | Admitting: Ophthalmology

## 2021-07-21 DIAGNOSIS — H25813 Combined forms of age-related cataract, bilateral: Secondary | ICD-10-CM

## 2021-07-21 DIAGNOSIS — I10 Essential (primary) hypertension: Secondary | ICD-10-CM

## 2021-07-21 DIAGNOSIS — H35033 Hypertensive retinopathy, bilateral: Secondary | ICD-10-CM | POA: Diagnosis not present

## 2021-07-21 DIAGNOSIS — E113513 Type 2 diabetes mellitus with proliferative diabetic retinopathy with macular edema, bilateral: Secondary | ICD-10-CM

## 2021-07-21 MED ORDER — BEVACIZUMAB CHEMO INJECTION 1.25MG/0.05ML SYRINGE FOR KALEIDOSCOPE
1.2500 mg | INTRAVITREAL | Status: AC | PRN
  Administered 2021-07-21: 1.25 mg via INTRAVITREAL

## 2021-08-11 DIAGNOSIS — Z85828 Personal history of other malignant neoplasm of skin: Secondary | ICD-10-CM | POA: Diagnosis not present

## 2021-08-11 DIAGNOSIS — D225 Melanocytic nevi of trunk: Secondary | ICD-10-CM | POA: Diagnosis not present

## 2021-08-11 DIAGNOSIS — L570 Actinic keratosis: Secondary | ICD-10-CM | POA: Diagnosis not present

## 2021-08-11 DIAGNOSIS — L814 Other melanin hyperpigmentation: Secondary | ICD-10-CM | POA: Diagnosis not present

## 2021-08-17 NOTE — Progress Notes (Shared)
[facility] Note  08/18/2021     CHIEF COMPLAINT Patient presents for No chief complaint on file.    HISTORY OF PRESENT ILLNESS: Jason Bowen is a 61 y.o. male who presents to the clinic today for:       Referring physician: No referring provider defined for this encounter.  HISTORICAL INFORMATION:   Selected notes from the MEDICAL RECORD NUMBER Referred by [name] for eval of DR w/DME OS   CURRENT MEDICATIONS: No current outpatient medications on file. (Ophthalmic Drugs)   No current facility-administered medications for this visit. (Ophthalmic Drugs)   Current Outpatient Medications (Other)  Medication Sig   dapagliflozin propanediol (FARXIGA) 10 MG TABS tablet Take 10 mg by mouth daily.   Insulin Degludec (TRESIBA) 100 UNIT/ML SOLN Inject 36 Units into the skin daily.   insulin lispro (HUMALOG) 100 UNIT/ML injection Inject 15 Units into the skin 3 (three) times daily before meals.   lisinopril (ZESTRIL) 20 MG tablet Take 20 mg by mouth daily.   lisinopril (ZESTRIL) 20 MG tablet 1 tablet (Patient not taking: Reported on 12/09/2020)   metFORMIN (GLUCOPHAGE) 500 MG tablet Take 500 mg by mouth 2 (two) times daily with a meal.   metFORMIN (GLUCOPHAGE-XR) 500 MG 24 hr tablet 1 tablet with evening meal (Patient not taking: Reported on 12/09/2020)   tirzepatide (MOUNJARO) 5 MG/0.5ML Pen Inject 5 mg into the skin once a week.   triamcinolone cream (KENALOG) 0.1 % SMARTSIG:1 Application Topical 2-3 Times Daily   No current facility-administered medications for this visit. (Other)   REVIEW OF SYSTEMS:   ALLERGIES No Known Allergies  PAST MEDICAL HISTORY Past Medical History:  Diagnosis Date   Cancer ([place])    Cataract    Diabetic retinopathy ([place])    Hypertension    Hypertensive retinopathy    Past Surgical History:  Procedure Laterality Date   ACHILLES TENDON SURGERY     ANKLE SURGERY     CARPAL TUNNEL WITH CUBITAL TUNNEL     MOHS  SURGERY     has had 20+ surgeries for this on different areas of body   FAMILY HISTORY Family History  Problem Relation Age of Onset   Hypertension Mother    Diabetes Mother    Cataracts Mother    Cancer Paternal Grandmother    SOCIAL HISTORY Social History   Tobacco Use   Smoking status: Never   Smokeless tobacco: Never  Vaping Use   Vaping Use: Never used  Substance Use Topics   Alcohol use: Not Currently       OPHTHALMIC EXAM: Not recorded    IMAGING AND PROCEDURES  Imaging and Procedures for 08/18/2021          ASSESSMENT/PLAN: No diagnosis found.   1. Proliferative diabetic retinopathy, both eyes  - s/p PRP OD (07.20.22)  - s/p PRP OS (08.03.22)  - s/p IVA OU #1 (08.17.22), #2 (09.14.22), #3 (10.12.22), #4 (11.09.22), #5 (12.21.22), #6 (01.20.23), #7 (02.20.23), #8 (03.29.23)  - history of poor BG control -- now improved on Monjouro  - BCVA OD: 20/20; OS: 20/25 - both stable - exam shows scattered IRH and NVE OU; OD with small subhyaloid heme -- fading, almost resolved - FA (07.12.22) shows scattered patches of NVE OU - OCT shows OD: Persistent IRF/IRHM temporal macula; OS:  persistent IRF/IRHM temporal macula -- ?slight improvement at 4 weeks - recommend IVA OU #9 today, 04.26.23 for DME w/ f/u in 4 wks - pt wishes  to proceed with injections - RBA of procedure discussed, questions answered - informed consent obtained and signed - see procedure note - discussed possible IVA resistance and switch to IVE next visit - f/u in 4 wks -- DFE/OCT, possible injection(s) v. focal laser  2,3. Hypertensive retinopathy OU - discussed importance of tight BP control - monitor   4.   Mixed Cataract OU - The symptoms of cataract, surgical options, and treatments and risks were discussed with patient. - discussed diagnosis and progression - not yet visually significant - monitor  Ophthalmic Meds Ordered this visit:  No orders of the defined types were placed in  this encounter.    No follow-ups on file.  There are no Patient Instructions on file for this visit.  This document serves as a record of services personally performed by Gardiner Sleeper, MD, PhD. It was created on their behalf by Annie Paras, COT, an ophthalmic technician. The creation of this record is the provider's dictation and/or activities during the visit.    Electronically signed by: Annie Paras, COT 08/17/21 12:52 PM   Gardiner Sleeper, M.D., Ph.D. Diseases & Surgery of the Retina and Vitreous [facility]: M myopia (nearsighted); A astigmatism; H hyperopia (farsighted); P presbyopia; Mrx spectacle prescription;  CTL contact lenses; OD right eye; OS left eye; OU both eyes  XT exotropia; ET esotropia; PEK punctate epithelial keratitis; PEE punctate epithelial erosions; DES dry eye syndrome; MGD meibomian gland dysfunction; ATs artificial tears; PFAT's preservative free artificial tears; [place] nuclear sclerotic cataract; PSC posterior subcapsular cataract; ERM epi-retinal membrane; PVD posterior vitreous detachment; RD retinal detachment; DM diabetes mellitus; DR diabetic retinopathy; NPDR non-proliferative diabetic retinopathy; PDR proliferative diabetic retinopathy; CSME clinically significant macular edema; DME diabetic macular edema; dbh dot blot hemorrhages; CWS cotton wool spot; POAG primary open angle glaucoma; C/D cup-to-disc ratio; HVF humphrey visual field; GVF goldmann visual field; OCT optical coherence tomography; IOP intraocular pressure; BRVO Branch retinal vein occlusion; CRVO central retinal vein occlusion; CRAO central retinal artery occlusion; BRAO branch retinal artery occlusion; RT retinal tear; SB scleral buckle; PPV pars plana vitrectomy; VH Vitreous hemorrhage; PRP panretinal laser photocoagulation; IVK intravitreal kenalog; VMT vitreomacular traction; MH Macular hole;  NVD neovascularization of the disc; NVE  neovascularization elsewhere; AREDS age related eye disease study; ARMD age related macular degeneration; POAG primary open angle glaucoma; EBMD epithelial/anterior basement membrane dystrophy; ACIOL anterior chamber intraocular lens; IOL intraocular lens; PCIOL posterior chamber intraocular lens; Phaco/IOL phacoemulsification with intraocular lens placement; [place] photorefractive keratectomy; LASIK laser assisted in situ keratomileusis; HTN hypertension; DM diabetes mellitus; COPD chronic obstructive pulmonary disease

## 2021-08-18 ENCOUNTER — Encounter (INDEPENDENT_AMBULATORY_CARE_PROVIDER_SITE_OTHER): Payer: BC Managed Care – PPO | Admitting: Ophthalmology

## 2021-08-18 ENCOUNTER — Ambulatory Visit (INDEPENDENT_AMBULATORY_CARE_PROVIDER_SITE_OTHER): Payer: BC Managed Care – PPO | Admitting: Ophthalmology

## 2021-08-18 ENCOUNTER — Encounter (INDEPENDENT_AMBULATORY_CARE_PROVIDER_SITE_OTHER): Payer: Self-pay | Admitting: Ophthalmology

## 2021-08-18 DIAGNOSIS — E113513 Type 2 diabetes mellitus with proliferative diabetic retinopathy with macular edema, bilateral: Secondary | ICD-10-CM

## 2021-08-18 DIAGNOSIS — H35033 Hypertensive retinopathy, bilateral: Secondary | ICD-10-CM | POA: Diagnosis not present

## 2021-08-18 DIAGNOSIS — I10 Essential (primary) hypertension: Secondary | ICD-10-CM | POA: Diagnosis not present

## 2021-08-18 DIAGNOSIS — H25813 Combined forms of age-related cataract, bilateral: Secondary | ICD-10-CM | POA: Diagnosis not present

## 2021-08-18 MED ORDER — BEVACIZUMAB CHEMO INJECTION 1.25MG/0.05ML SYRINGE FOR KALEIDOSCOPE
1.2500 mg | INTRAVITREAL | Status: AC | PRN
  Administered 2021-08-18: 1.25 mg via INTRAVITREAL

## 2021-08-18 MED ORDER — AFLIBERCEPT 2MG/0.05ML IZ SOLN FOR KALEIDOSCOPE
2.0000 mg | INTRAVITREAL | Status: AC | PRN
  Administered 2021-08-18: 2 mg via INTRAVITREAL

## 2021-08-18 NOTE — Progress Notes (Incomplete)
[facility] Note  08/18/2021     CHIEF COMPLAINT Patient presents for No chief complaint on file.    HISTORY OF PRESENT ILLNESS: Jason Bowen is a 61 y.o. male who presents to the clinic today for:    HPI   4 week follow up PDR OU- Doing well, no change since last visit.  BS 108 this morning A1C 6.5 Last edited by [name], COA on 08/18/2021  2:25 PM.       Referring physician: No referring provider defined for this encounter.  HISTORICAL INFORMATION:   Selected notes from the MEDICAL RECORD NUMBER Referred by [name] for eval of DR w/DME OS   CURRENT MEDICATIONS: No current outpatient medications on file. (Ophthalmic Drugs)   No current facility-administered medications for this visit. (Ophthalmic Drugs)   Current Outpatient Medications (Other)  Medication Sig   dapagliflozin propanediol (FARXIGA) 10 MG TABS tablet Take 10 mg by mouth daily.   Insulin Degludec (TRESIBA) 100 UNIT/ML SOLN Inject 36 Units into the skin daily.   insulin lispro (HUMALOG) 100 UNIT/ML injection Inject 15 Units into the skin 3 (three) times daily before meals.   lisinopril (ZESTRIL) 20 MG tablet Take 20 mg by mouth daily.   metFORMIN (GLUCOPHAGE) 500 MG tablet Take 500 mg by mouth 2 (two) times daily with a meal.   tirzepatide (MOUNJARO) 5 MG/0.5ML Pen Inject 5 mg into the skin once a week.   triamcinolone cream (KENALOG) 0.1 % SMARTSIG:1 Application Topical 2-3 Times Daily   lisinopril (ZESTRIL) 20 MG tablet 1 tablet (Patient not taking: Reported on 12/09/2020)   metFORMIN (GLUCOPHAGE-XR) 500 MG 24 hr tablet 1 tablet with evening meal (Patient not taking: Reported on 12/09/2020)   No current facility-administered medications for this visit. (Other)   REVIEW OF SYSTEMS: ROS   Positive for: Endocrine, Eyes Negative for: Constitutional, Gastrointestinal, Neurological, Skin, Genitourinary, Musculoskeletal, HENT, Cardiovascular, Respiratory, Psychiatric,  Allergic/Imm, Heme/Lymph Last edited by [name], COA on 08/18/2021  1:31 PM.      ALLERGIES No Known Allergies  PAST MEDICAL HISTORY Past Medical History:  Diagnosis Date   Cancer ([place])    Cataract    Diabetic retinopathy ([place])    Hypertension    Hypertensive retinopathy    Past Surgical History:  Procedure Laterality Date   ACHILLES TENDON SURGERY     ANKLE SURGERY     CARPAL TUNNEL WITH CUBITAL TUNNEL     MOHS SURGERY     has had 20+ surgeries for this on different areas of body   FAMILY HISTORY Family History  Problem Relation Age of Onset   Hypertension Mother    Diabetes Mother    Cataracts Mother    Cancer Paternal Grandmother    SOCIAL HISTORY Social History   Tobacco Use   Smoking status: Never   Smokeless tobacco: Never  Vaping Use   Vaping Use: Never used  Substance Use Topics   Alcohol use: Not Currently       OPHTHALMIC EXAM: Base Eye Exam     Visual Acuity (Snellen - Linear)       Right Left   Dist cc 20/20 20/30 +2   Dist ph cc  NI    Correction: Glasses         Tonometry (Tonopen, 1:39 PM)       Right Left   Pressure 13 14         Pupils       Dark Light  Shape React APD   Right 3 2 Round Brisk None   Left 3 2 Round Brisk None         Visual Fields (Counting fingers)       Left Right    Full Full         Extraocular Movement       Right Left    Full Full         Neuro/Psych     Oriented x3: Yes   Mood/Affect: Normal         Dilation     Both eyes: 1.0% Mydriacyl, 2.5% Phenylephrine @ 1:39 PM           Slit Lamp and Fundus Exam     Slit Lamp Exam       Right Left   Lids/Lashes Dermatochalasis - upper lid Dermatochalasis - upper lid   Conjunctiva/Sclera White and quiet White and quiet   Cornea arcus arcus   Anterior Chamber deep, clear, narrow temporal angle deep, clear, narrow temporal angle   Iris Round and dilated, No NVI Round and dilated, No NVI   Lens Clear Trace Posterior  subcapsular cataract   Anterior Vitreous mild syneresis mild syneresis         Fundus Exam       Right Left   Disc Pink and Sharp mild Pallor, Sharp rim   C/D Ratio 0.2 0.2   Macula Flat, blunted foveal reflex, +edema; focal MA/DBH with exudates ST to fovea, boat shaped subhyalod heme IT to fovea -- almost resolved, white in color Blunted foveal reflex, circinate exudates and edema temporal macula, scattered MA/DBH   Vessels attenuated, mild tortuosity attenuated, mild tortuousity, scattered, early NV greatest superior to disc--regressing   Periphery Attached, 360 DBH, patches of NVE, good 360 PRP, room for fill in temporally and posteriorly if needed Attached, 360 DBH, patches of NVE regressing, good 360 PRP           Refraction     Wearing Rx       Sphere Cylinder Add   Right +1.75 Sphere +2.50   Left +1.75 Sphere +2.50           IMAGING AND PROCEDURES  Imaging and Procedures for 08/18/2021  OCT, Retina - OU - Both Eyes       Right Eye Quality was good. Central Foveal Thickness: 254. Progression has improved. Findings include abnormal foveal contour, intraretinal fluid, intraretinal hyper-reflective material, no SRF (Interval improvement IRF/IRHM temporal macula).   Left Eye Quality was good. Central Foveal Thickness: 241. Progression has been stable. Findings include abnormal foveal contour, intraretinal fluid, intraretinal hyper-reflective material, no SRF, vitreomacular adhesion (Interval improvement in IRF/IRHM temporal macula ).   Notes *Images captured and stored on drive  Diagnosis / Impression:  +DME OU OD: Interval improvement in IRF/IRHM temporal macula OS: Interval improvement in IRF/IRHM temporal macula  Clinical management:  See below  Abbreviations: NFP - Normal foveal profile. CME - cystoid macular edema. PED - pigment epithelial detachment. IRF - intraretinal fluid. SRF - subretinal fluid. EZ - ellipsoid zone. ERM - epiretinal membrane. ORA -  outer retinal atrophy. ORT - outer retinal tubulation. SRHM - subretinal hyper-reflective material. IRHM - intraretinal hyper-reflective material      Intravitreal Injection, Pharmacologic Agent - OD - Right Eye       Time Out 08/18/2021. 2:43 PM. Confirmed correct patient, procedure, site, and patient consented.   Anesthesia Topical anesthesia was used. Anesthetic medications included Lidocaine 2%, Proparacaine  0.5%.   Procedure Preparation included 5% betadine to ocular surface, eyelid speculum. A supplied needle was used.   Injection: 1.25 mg Bevacizumab 1.'25mg'$ /0.98m   Route: Intravitreal, Site: Right Eye   NDC: [id number] Lot:: [id number] Expiration date: 10/10/2021   Post-op Post injection exam found visual acuity of at least counting fingers. The patient tolerated the procedure well. There were no complications. The patient received written and verbal post procedure care education. Post injection medications were not given.      Intravitreal Injection, Pharmacologic Agent - OS - Left Eye       Time Out 08/18/2021. 2:43 PM. Confirmed correct patient, procedure, site, and patient consented.   Anesthesia Topical anesthesia was used. Anesthetic medications included Lidocaine 2%, Proparacaine 0.5%.   Procedure Preparation included 5% betadine to ocular surface, eyelid speculum. A (32g) needle was used.   Injection: 2 mg aflibercept 2 MG/0.05ML   Route: Intravitreal, Site: Left Eye   NDC: [id number] Lot: [id number] Expiration date: 06/26/2022, Waste: 0 mL   Post-op Post injection exam found visual acuity of at least counting fingers. The patient tolerated the procedure well. There were no complications. The patient received written and verbal post procedure care education. Post injection medications were not given.             ASSESSMENT/PLAN:   ICD-10-CM   1. Proliferative diabetic retinopathy of both eyes with macular edema associated with type 2 diabetes  mellitus (HCC)  E11.3513 OCT, Retina - OU - Both Eyes    Intravitreal Injection, Pharmacologic Agent - OD - Right Eye    Intravitreal Injection, Pharmacologic Agent - OS - Left Eye    2. Essential hypertension  I10     3. Hypertensive retinopathy of both eyes  H35.033     4. Combined forms of age-related cataract of both eyes  H25.813       1. Proliferative diabetic retinopathy, both eyes  - s/p PRP OD (07.20.22)  - s/p PRP OS (08.03.22)  - s/p IVA OU #1 (08.17.22), #2 (09.14.22), #3 (10.12.22), #4 (11.09.22), #5 (12.21.22), #6 (01.20.23), #7 (02.20.23), #8 (03.29.23), #9 (04.26.23)  - history of poor BG control -- now improved on Monjouro  - BCVA OD: 20/20; OS: 20/30+2 - exam shows scattered IRH and NVE OU; OD with small subhyaloid heme -- fading, almost resolved - FA (07.12.22) shows scattered patches of NVE OU - OCT shows OD: Interval improvement in IRF/IRHM temporal macula; OS:  Interval improvement in IRF/IRHM temporal macula at 4 weeks - recommend IVA OD #10 and IVE OS #1 today, 05.24.23 for DME w/ f/u in 4 wks - pt wishes to proceed with injections - RBA of procedure discussed, questions answered - Avastin, informed consent obtained and signed, 08.17.2022 (OU) - Eylea, informed consent obtained and signed, 05.24.23 (OS) - see procedure note - possible IVA resistance, switch to IVE OS today, 05.24.23 - f/u in 4 wks -- DFE/OCT, possible injection(s)   2,3. Hypertensive retinopathy OU - discussed importance of tight BP control - monitor   4.   Mixed Cataract OU - The symptoms of cataract, surgical options, and treatments and risks were discussed with patient. - discussed diagnosis and progression - not yet visually significant - monitor for now  Ophthalmic Meds Ordered this visit:  No orders of the defined types were placed in this encounter.    Return in about 4 weeks (around 09/15/2021) for DFE, OCT, possible injection.  There are no Patient Instructions on file for  this visit.  This document serves as a record of services personally performed by Gardiner Sleeper, MD, PhD. It was created on their behalf by San Jetty. Owens Shark, OA an ophthalmic technician. The creation of this record is the provider's dictation and/or activities during the visit.    Electronically signed by: San Jetty. Owens Shark, [place] 05.24.2023 2:47 PM  This document serves as a record of services personally performed by Gardiner Sleeper, MD, PhD. It was created on their behalf by [name], an ophthalmic technician. The creation of this record is the provider's dictation and/or activities during the visit.    Electronically signed by: [name] COA, 08/18/21  2:47 PM   Gardiner Sleeper, M.D., Ph.D. Diseases & Surgery of the Retina and Vitreous [facility]: M myopia (nearsighted); A astigmatism; H hyperopia (farsighted); P presbyopia; Mrx spectacle prescription;  CTL contact lenses; OD right eye; OS left eye; OU both eyes  XT exotropia; ET esotropia; PEK punctate epithelial keratitis; PEE punctate epithelial erosions; DES dry eye syndrome; MGD meibomian gland dysfunction; ATs artificial tears; PFAT's preservative free artificial tears; [place] nuclear sclerotic cataract; PSC posterior subcapsular cataract; ERM epi-retinal membrane; PVD posterior vitreous detachment; RD retinal detachment; DM diabetes mellitus; DR diabetic retinopathy; NPDR non-proliferative diabetic retinopathy; PDR proliferative diabetic retinopathy; CSME clinically significant macular edema; DME diabetic macular edema; dbh dot blot hemorrhages; CWS cotton wool spot; POAG primary open angle glaucoma; C/D cup-to-disc ratio; HVF humphrey visual field; GVF goldmann visual field; OCT optical coherence tomography; IOP intraocular pressure; BRVO Branch retinal vein occlusion; CRVO central retinal vein occlusion; CRAO central retinal artery occlusion; BRAO branch retinal artery occlusion; RT retinal  tear; SB scleral buckle; PPV pars plana vitrectomy; VH Vitreous hemorrhage; PRP panretinal laser photocoagulation; IVK intravitreal kenalog; VMT vitreomacular traction; MH Macular hole;  NVD neovascularization of the disc; NVE neovascularization elsewhere; AREDS age related eye disease study; ARMD age related macular degeneration; POAG primary open angle glaucoma; EBMD epithelial/anterior basement membrane dystrophy; ACIOL anterior chamber intraocular lens; IOL intraocular lens; PCIOL posterior chamber intraocular lens; Phaco/IOL phacoemulsification with intraocular lens placement; [place] photorefractive keratectomy; LASIK laser assisted in situ keratomileusis; HTN hypertension; DM diabetes mellitus; COPD chronic obstructive pulmonary disease

## 2021-08-18 NOTE — Progress Notes (Incomplete)
[facility] Note  08/18/2021     CHIEF COMPLAINT Patient presents for No chief complaint on file.    HISTORY OF PRESENT ILLNESS: Jason Bowen is a 61 y.o. male who presents to the clinic today for:    HPI   4 week follow up PDR OU-  BS 108 this morning A1C 6.5 Last edited by [name], [place] on 08/18/2021  1:31 PM.       Referring physician: No referring provider defined for this encounter.  HISTORICAL INFORMATION:   Selected notes from the MEDICAL RECORD NUMBER Referred by [name] for eval of DR w/DME OS   CURRENT MEDICATIONS: No current outpatient medications on file. (Ophthalmic Drugs)   No current facility-administered medications for this visit. (Ophthalmic Drugs)   Current Outpatient Medications (Other)  Medication Sig   dapagliflozin propanediol (FARXIGA) 10 MG TABS tablet Take 10 mg by mouth daily.   Insulin Degludec (TRESIBA) 100 UNIT/ML SOLN Inject 36 Units into the skin daily.   insulin lispro (HUMALOG) 100 UNIT/ML injection Inject 15 Units into the skin 3 (three) times daily before meals.   lisinopril (ZESTRIL) 20 MG tablet Take 20 mg by mouth daily.   lisinopril (ZESTRIL) 20 MG tablet 1 tablet (Patient not taking: Reported on 12/09/2020)   metFORMIN (GLUCOPHAGE) 500 MG tablet Take 500 mg by mouth 2 (two) times daily with a meal.   metFORMIN (GLUCOPHAGE-XR) 500 MG 24 hr tablet 1 tablet with evening meal (Patient not taking: Reported on 12/09/2020)   tirzepatide (MOUNJARO) 5 MG/0.5ML Pen Inject 5 mg into the skin once a week.   triamcinolone cream (KENALOG) 0.1 % SMARTSIG:1 Application Topical 2-3 Times Daily   No current facility-administered medications for this visit. (Other)   REVIEW OF SYSTEMS: ROS   Positive for: Endocrine, Eyes Negative for: Constitutional, Gastrointestinal, Neurological, Skin, Genitourinary, Musculoskeletal, HENT, Cardiovascular, Respiratory, Psychiatric, Allergic/Imm, Heme/Lymph Last edited by  [name], COA on 08/18/2021  1:31 PM.      ALLERGIES No Known Allergies  PAST MEDICAL HISTORY Past Medical History:  Diagnosis Date   Cancer ([place])    Cataract    Diabetic retinopathy ([place] [facility])    Hypertension    Hypertensive retinopathy    Past Surgical History:  Procedure Laterality Date   ACHILLES TENDON SURGERY     ANKLE SURGERY     CARPAL TUNNEL WITH CUBITAL TUNNEL     MOHS SURGERY     has had 20+ surgeries for this on different areas of body   FAMILY HISTORY Family History  Problem Relation Age of Onset   Hypertension Mother    Diabetes Mother    Cataracts Mother    Cancer Paternal Grandmother    SOCIAL HISTORY Social History   Tobacco Use   Smoking status: Never   Smokeless tobacco: Never  Vaping Use   Vaping Use: Never used  Substance Use Topics   Alcohol use: Not Currently       OPHTHALMIC EXAM: Base Eye Exam     Visual Acuity (Snellen - Linear)       Right Left   Dist cc 20/20 20/30 +2   Dist ph cc  NI    Correction: Glasses         Tonometry (Tonopen, 1:39 PM)       Right Left   Pressure 13 14         Pupils       Dark Light Shape React APD   Right 3  2 Round Brisk None   Left 3 2 Round Brisk None         Visual Fields (Counting fingers)       Left Right    Full Full         Extraocular Movement       Right Left    Full Full         Neuro/Psych     Oriented x3: Yes   Mood/Affect: Normal         Dilation     Both eyes: 1.0% Mydriacyl, 2.5% Phenylephrine @ 1:39 PM           Slit Lamp and Fundus Exam     Slit Lamp Exam       Right Left   Lids/Lashes Dermatochalasis - upper lid Dermatochalasis - upper lid   Conjunctiva/Sclera White and quiet White and quiet   Cornea arcus arcus   Anterior Chamber deep, clear, narrow temporal angle deep, clear, narrow temporal angle   Iris Round and dilated, No NVI Round and dilated, No NVI   Lens Clear Trace Posterior subcapsular cataract   Vitreous mild  syneresis mild syneresis         Fundus Exam       Right Left   Disc Pink and Sharp mild Pallor, Sharp rim   C/D Ratio 0.2 0.2   Macula Flat, blunted foveal reflex, +edema; focal MA/DBH with exudates ST to fovea, boat shaped subhyalod heme IT to fovea -- almost resolved Blunted foveal reflex, circinate exudates and edema temporal macula, scattered MA/DBH   Vessels attenuated, mild tortuosity attenuated, mild tortuousity, scattered, early NV greatest superior to disc--regressing   Periphery Attached, 360 DBH, patches of NVE, good 360 PRP, room for fill in temporally and posteriorly if needed Attached, 360 DBH, patches of NVE regressing, good 360 PRP           Refraction     Wearing Rx       Sphere Cylinder Add   Right +1.75 Sphere +2.50   Left +1.75 Sphere +2.50           IMAGING AND PROCEDURES  Imaging and Procedures for 08/18/2021          ASSESSMENT/PLAN:   ICD-10-CM   1. Proliferative diabetic retinopathy of both eyes with macular edema associated with type 2 diabetes mellitus ([place])  E11.3513 OCT, Retina - OU - Both Eyes    2. Essential hypertension  I10     3. Hypertensive retinopathy of both eyes  H35.033     4. Combined forms of age-related cataract of both eyes  H25.813       1. Proliferative diabetic retinopathy, both eyes  - s/p PRP OD (07.20.22)  - s/p PRP OS (08.03.22)  - s/p IVA OU #1 (08.17.22), #2 (09.14.22), #3 (10.12.22), #4 (11.09.22), #5 (12.21.22), #6 (01.20.23), #7 (02.20.23), #8 (03.29.23), #9 (  - history of poor BG control -- now improved on Monjouro  - BCVA OD: 20/20; OS: 20/25 - both stable - exam shows scattered IRH and NVE OU; OD with small subhyaloid heme -- fading, almost resolved - FA (07.12.22) shows scattered patches of NVE OU - OCT shows OD: Persistent IRF/IRHM temporal macula; OS:  persistent IRF/IRHM temporal macula -- ?slight improvement at 4 weeks - recommend IVA OU #10 today, 05.24.23 for DME w/ f/u in 4 wks - pt  wishes to proceed with injections - RBA of procedure discussed, questions answered - informed consent obtained and signed -  see procedure note - discussed possible IVA resistance and switch to IVE next visit - f/u in 4 wks -- DFE/OCT, possible injection(s) v. focal laser  2,3. Hypertensive retinopathy OU - discussed importance of tight BP control - monitor   4.   Mixed Cataract OU - The symptoms of cataract, surgical options, and treatments and risks were discussed with patient. - discussed diagnosis and progression - not yet visually significant - monitor for now  Ophthalmic Meds Ordered this visit:  No orders of the defined types were placed in this encounter.    No follow-ups on file.  There are no Patient Instructions on file for this visit.  This document serves as a record of services personally performed by Gardiner Sleeper, MD, PhD. It was created on their behalf by San Jetty. Owens Shark, OA an ophthalmic technician. The creation of this record is the provider's dictation and/or activities during the visit.    Electronically signed by: San Jetty. [place], [place] 05.24.2023 2:21 PM   Gardiner Sleeper, M.D., Ph.D. Diseases & Surgery of the Retina and Vitreous [facility] [place]: M myopia (nearsighted); A astigmatism; H hyperopia (farsighted); P presbyopia; Mrx spectacle prescription;  CTL contact lenses; OD right eye; OS left eye; OU both eyes  XT exotropia; ET esotropia; PEK punctate epithelial keratitis; PEE punctate epithelial erosions; DES dry eye syndrome; MGD meibomian gland dysfunction; ATs artificial tears; PFAT's preservative free artificial tears; [place] nuclear sclerotic cataract; PSC posterior subcapsular cataract; ERM epi-retinal membrane; PVD posterior vitreous detachment; RD retinal detachment; DM diabetes mellitus; DR diabetic retinopathy; NPDR non-proliferative diabetic retinopathy; PDR proliferative diabetic retinopathy; CSME clinically  significant macular edema; DME diabetic macular edema; dbh dot blot hemorrhages; CWS cotton wool spot; POAG primary open angle glaucoma; C/D cup-to-disc ratio; HVF humphrey visual field; GVF goldmann visual field; OCT optical coherence tomography; IOP intraocular pressure; BRVO Branch retinal vein occlusion; CRVO central retinal vein occlusion; CRAO central retinal artery occlusion; BRAO branch retinal artery occlusion; RT retinal tear; SB scleral buckle; PPV pars plana vitrectomy; VH Vitreous hemorrhage; PRP panretinal laser photocoagulation; IVK intravitreal kenalog; VMT vitreomacular traction; MH Macular hole;  NVD neovascularization of the disc; NVE neovascularization elsewhere; AREDS age related eye disease study; ARMD age related macular degeneration; POAG primary open angle glaucoma; EBMD epithelial/anterior basement membrane dystrophy; ACIOL anterior chamber intraocular lens; IOL intraocular lens; PCIOL posterior chamber intraocular lens; Phaco/IOL phacoemulsification with intraocular lens placement; [place] photorefractive keratectomy; LASIK laser assisted in situ keratomileusis; HTN hypertension; DM diabetes mellitus; COPD chronic obstructive pulmonary disease

## 2021-08-30 DIAGNOSIS — Z794 Long term (current) use of insulin: Secondary | ICD-10-CM | POA: Diagnosis not present

## 2021-08-30 DIAGNOSIS — E1165 Type 2 diabetes mellitus with hyperglycemia: Secondary | ICD-10-CM | POA: Diagnosis not present

## 2021-08-30 DIAGNOSIS — E1169 Type 2 diabetes mellitus with other specified complication: Secondary | ICD-10-CM | POA: Diagnosis not present

## 2021-08-30 DIAGNOSIS — E113213 Type 2 diabetes mellitus with mild nonproliferative diabetic retinopathy with macular edema, bilateral: Secondary | ICD-10-CM | POA: Diagnosis not present

## 2021-09-01 DIAGNOSIS — E1165 Type 2 diabetes mellitus with hyperglycemia: Secondary | ICD-10-CM | POA: Diagnosis not present

## 2021-09-15 ENCOUNTER — Encounter (INDEPENDENT_AMBULATORY_CARE_PROVIDER_SITE_OTHER): Payer: BC Managed Care – PPO | Admitting: Ophthalmology

## 2021-09-24 ENCOUNTER — Encounter (INDEPENDENT_AMBULATORY_CARE_PROVIDER_SITE_OTHER): Payer: BC Managed Care – PPO | Admitting: Ophthalmology

## 2021-10-01 NOTE — Progress Notes (Signed)
[facility] Note  10/04/2021    CHIEF COMPLAINT Patient presents for Retina Follow Up  HISTORY OF PRESENT ILLNESS: Jason Bowen is a 61 y.o. male who presents to the clinic today for:    HPI     Retina Follow Up   Patient presents with  Diabetic Retinopathy.  In both eyes.  Severity is moderate.  Duration of 7 weeks.  Since onset it is stable.  I, the attending physician,  performed the HPI with the patient and updated documentation appropriately.        Comments   Pt here for 7 wk ret f/u PDR OU. Pt 3 wks delayed. Pt states VA is the same, no change.      Last edited by [name], MD on 10/04/2021  9:41 PM.      Referring physician: No referring provider defined for this encounter.  HISTORICAL INFORMATION:   Selected notes from the MEDICAL RECORD NUMBER Referred by [name] for eval of DR w/DME OS   CURRENT MEDICATIONS: No current outpatient medications on file. (Ophthalmic Drugs)   No current facility-administered medications for this visit. (Ophthalmic Drugs)   Current Outpatient Medications (Other)  Medication Sig   dapagliflozin propanediol (FARXIGA) 10 MG TABS tablet Take 10 mg by mouth daily.   Insulin Degludec (TRESIBA) 100 UNIT/ML SOLN Inject 36 Units into the skin daily.   insulin lispro (HUMALOG) 100 UNIT/ML injection Inject 15 Units into the skin 3 (three) times daily before meals.   lisinopril (ZESTRIL) 20 MG tablet Take 20 mg by mouth daily.   metFORMIN (GLUCOPHAGE) 500 MG tablet Take 500 mg by mouth 2 (two) times daily with a meal.   tirzepatide (MOUNJARO) 5 MG/0.5ML Pen Inject 5 mg into the skin once a week.   triamcinolone cream (KENALOG) 0.1 % SMARTSIG:1 Application Topical 2-3 Times Daily   lisinopril (ZESTRIL) 20 MG tablet 1 tablet (Patient not taking: Reported on 12/09/2020)   metFORMIN (GLUCOPHAGE-XR) 500 MG 24 hr tablet 1 tablet with evening meal (Patient not taking: Reported on 12/09/2020)   No current  facility-administered medications for this visit. (Other)   REVIEW OF SYSTEMS: ROS   Positive for: Endocrine, Eyes Negative for: Constitutional, Gastrointestinal, Neurological, Skin, Genitourinary, Musculoskeletal, HENT, Cardiovascular, Respiratory, Psychiatric, Allergic/Imm, Heme/Lymph Last edited by Kingsley Spittle, COT on 10/04/2021  1:41 PM.     ALLERGIES No Known Allergies  PAST MEDICAL HISTORY Past Medical History:  Diagnosis Date   Cancer ([place])    Cataract    Diabetic retinopathy ([place])    Hypertension    Hypertensive retinopathy    Past Surgical History:  Procedure Laterality Date   ACHILLES TENDON SURGERY     ANKLE SURGERY     CARPAL TUNNEL WITH CUBITAL TUNNEL     MOHS SURGERY     has had 20+ surgeries for this on different areas of body   FAMILY HISTORY Family History  Problem Relation Age of Onset   Hypertension Mother    Diabetes Mother    Cataracts Mother    Cancer Paternal Grandmother    SOCIAL HISTORY Social History   Tobacco Use   Smoking status: Never   Smokeless tobacco: Never  Vaping Use   Vaping Use: Never used  Substance Use Topics   Alcohol use: Not Currently   Drug use: Not Currently       OPHTHALMIC EXAM: Base Eye Exam     Visual Acuity (Snellen - Linear)  Right Left   Dist cc 20/25 20/40 -2   Dist ph cc NI NI    Correction: Glasses  Pt wearing old rx specs w/ tint in them MS        Tonometry (Tonopen, 1:50 PM)       Right Left   Pressure 13 14         Pupils       Dark Light Shape React APD   Right 3 2 Round Brisk None   Left 3 2 Round Brisk None         Visual Fields (Counting fingers)       Left Right    Full Full         Extraocular Movement       Right Left    Full, Ortho Full, Ortho         Neuro/Psych     Oriented x3: Yes   Mood/Affect: Normal         Dilation     Both eyes: 1.0% Mydriacyl, 2.5% Phenylephrine @ 1:51 PM           Slit Lamp and Fundus Exam     Slit  Lamp Exam       Right Left   Lids/Lashes Dermatochalasis - upper lid Dermatochalasis - upper lid   Conjunctiva/Sclera White and quiet White and quiet   Cornea arcus arcus   Anterior Chamber deep, clear, narrow temporal angle deep, clear, narrow temporal angle   Iris Round and dilated, No NVI Round and dilated, No NVI   Lens Clear Trace Posterior subcapsular cataract   Anterior Vitreous mild syneresis mild syneresis         Fundus Exam       Right Left   Disc Pink and Sharp mild Pallor, Sharp rim   C/D Ratio 0.2 0.2   Macula Flat, blunted foveal reflex, +edema; focal MA/DBH with exudates ST to fovea, boat shaped subhyalod heme IT to fovea -- almost resolved, white in color Blunted foveal reflex, circinate exudates and edema temporal macula, scattered MA/DBH - improving   Vessels attenuated, mild tortuosity attenuated, mild tortuousity, scattered, early NV greatest superior to disc--regressing   Periphery Attached, 360 DBH, patches of NVE, good 360 PRP, room for fill in temporally and posteriorly if needed Attached, 360 DBH, patches of NVE regressing, good 360 PRP           Refraction     Wearing Rx       Sphere Cylinder Add   Right +1.75 Sphere +2.50   Left +1.75 Sphere +2.50         Manifest Refraction       Sphere Cylinder Axis Dist VA   Right       Left +1.75 +0.75 165 20/40+2           IMAGING AND PROCEDURES  Imaging and Procedures for 10/04/2021  OCT, Retina - OU - Both Eyes       Right Eye Quality was good. Central Foveal Thickness: 260. Progression has been stable. Findings include no SRF, abnormal foveal contour, intraretinal hyper-reflective material, intraretinal fluid (Persistent IRF/IRHM temporal macula -- ?mild improvement).   Left Eye Quality was good. Central Foveal Thickness: 231. Progression has improved. Findings include no SRF, abnormal foveal contour, intraretinal hyper-reflective material, intraretinal fluid, vitreomacular adhesion (Mild  interval improvement in IRF/IRHM temporal macula ).   Notes *Images captured and stored on drive  Diagnosis / Impression:  +DME OU OD: Persistent IRF/IRHM temporal  macula -- ?mild improvement OS: mild interval improvement in IRF/IRHM temporal macula   Clinical management:  See below  Abbreviations: NFP - Normal foveal profile. CME - cystoid macular edema. PED - pigment epithelial detachment. IRF - intraretinal fluid. SRF - subretinal fluid. EZ - ellipsoid zone. ERM - epiretinal membrane. ORA - outer retinal atrophy. ORT - outer retinal tubulation. SRHM - subretinal hyper-reflective material. IRHM - intraretinal hyper-reflective material      Intravitreal Injection, Pharmacologic Agent - OD - Right Eye       Time Out 10/04/2021. 2:56 PM. Confirmed correct patient, procedure, site, and patient consented.   Anesthesia Topical anesthesia was used. Anesthetic medications included Lidocaine 2%, Proparacaine 0.5%.   Procedure Preparation included 5% betadine to ocular surface, eyelid speculum. A (32g) needle was used.   Injection: 1.25 mg Bevacizumab 1.25m/0.05ml   Route: Intravitreal, Site: Right Eye   NDC: [id number] Lot:: [id number] Expiration date: 12/14/2021   Post-op Post injection exam found visual acuity of at least counting fingers. The patient tolerated the procedure well. There were no complications. The patient received written and verbal post procedure care education. Post injection medications were not given.      Intravitreal Injection, Pharmacologic Agent - OS - Left Eye       Time Out 10/04/2021. 2:57 PM. Confirmed correct patient, procedure, site, and patient consented.   Anesthesia Topical anesthesia was used. Anesthetic medications included Lidocaine 2%, Proparacaine 0.5%.   Procedure Preparation included 5% betadine to ocular surface, eyelid speculum. A (32g) needle was used.   Injection: 2 mg aflibercept 2 MG/0.05ML   Route: Intravitreal, Site: Left  Eye   NDC: [id number] Lot: [id number] Expiration date: 07/26/2022, Waste: 0 mL   Post-op Post injection exam found visual acuity of at least counting fingers. The patient tolerated the procedure well. There were no complications. The patient received written and verbal post procedure care education. Post injection medications were not given.   Notes An AC tap was performed following injection due to elevated IOP using a 30 gauge needle on a syringe with the plunger removed. The needle was placed at the limbus at 5 oclock and approximately 0.06cc of aqueous was removed from the anterior chamber. Betadine was applied to the tap area before and after the paracentesis was performed. There were no complications. The patient tolerated the procedure well. The IOP was rechecked and was found to be ~10 mmHg by tonopen.            ASSESSMENT/PLAN:   ICD-10-CM   1. Proliferative diabetic retinopathy of both eyes with macular edema associated with type 2 diabetes mellitus (HCC)  E11.3513 OCT, Retina - OU - Both Eyes    Intravitreal Injection, Pharmacologic Agent - OD - Right Eye    Intravitreal Injection, Pharmacologic Agent - OS - Left Eye    aflibercept (EYLEA) SOLN 2 mg    Bevacizumab (AVASTIN) SOLN 1.25 mg    2. Essential hypertension  I10     3. Hypertensive retinopathy of both eyes  H35.033     4. Combined forms of age-related cataract of both eyes  H25.813      1. Proliferative diabetic retinopathy, both eyes  - delayed f/u - 6+ wks instead of 4  - FA (07.12.22) shows scattered patches of NVE OU - s/p PRP OD (07.20.22)  - s/p PRP OS (08.03.22)  - s/p IVA OU #1 (08.17.22), #2 (09.14.22), #3 (10.12.22), #4 (11.09.22), #5 (12.21.22), #6 (01.20.23), #7 (02.20.23), #8 (03.29.23), #9 (04.26.23) -- mild  IVA resistance OS - s/p IVA OD #10 (05.24.23)  - s/p IVE OS #1 (05.24.23)  - history of poor BG control -- now improved on Monjouro  - BCVA OD: 20/25; OS: 20/40 -- both decreased -- pt  is wearing old glasses bc he broke his current pair - exam shows scattered IRH and NVE OU; OD with small subhyaloid heme -- fading, almost resolved - OCT shows OD: persistent IRF/IRHM temporal macula -- ?slightly improved; OS: Interval improvement in IRF/IRHM temporal macula at 6 weeks - recommend IVA OD #11 and IVE OS #2 today, 07.10.23 for DME w/ f/u in 4-6 wks - pt wishes to proceed with injections - RBA of procedure discussed, questions answered - Avastin, informed consent obtained and signed, 08.17.2022 (OU) - Eylea, informed consent obtained and signed, 05.24.23 (OS) - see procedure note - AC tap OS today - f/u in 4-6 wks -- DFE/OCT, possible injection(s)   2,3. Hypertensive retinopathy OU - discussed importance of tight BP control - monitor  4.   Mixed Cataract OU - The symptoms of cataract, surgical options, and treatments and risks were discussed with patient. - discussed diagnosis and progression - not yet visually significant - monitor for now  Ophthalmic Meds Ordered this visit:  Meds ordered this encounter  Medications   aflibercept (EYLEA) SOLN 2 mg   Bevacizumab (AVASTIN) SOLN 1.25 mg     Return for f/u 4-6 weeks, PDR OU, DFE, OCT.  There are no Patient Instructions on file for this visit.  This document serves as a record of services personally performed by Gardiner Sleeper, MD, PhD. It was created on their behalf by Roselee Nova, COMT. The creation of this record is the provider's dictation and/or activities during the visit.  Electronically signed by: Roselee Nova, COMT 10/04/21 9:43 PM  This document serves as a record of services personally performed by Gardiner Sleeper, MD, PhD. It was created on their behalf by San Jetty. Owens Shark, OA an ophthalmic technician. The creation of this record is the provider's dictation and/or activities during the visit.    Electronically signed by: San Jetty. Owens Shark, [place] 07.10.2023 9:43 PM   Gardiner Sleeper, M.D., Ph.D. Diseases &  Surgery of the Retina and Vitreous [facility]  I have reviewed the above documentation for accuracy and completeness, and I agree with the above. Gardiner Sleeper, M.D., Ph.D. 10/04/21 9:47 PM   Abbreviations: M myopia (nearsighted); A astigmatism; H hyperopia (farsighted); P presbyopia; Mrx spectacle prescription;  CTL contact lenses; OD right eye; OS left eye; OU both eyes  XT exotropia; ET esotropia; PEK punctate epithelial keratitis; PEE punctate epithelial erosions; DES dry eye syndrome; MGD meibomian gland dysfunction; ATs artificial tears; PFAT's preservative free artificial tears; [place] nuclear sclerotic cataract; PSC posterior subcapsular cataract; ERM epi-retinal membrane; PVD posterior vitreous detachment; RD retinal detachment; DM diabetes mellitus; DR diabetic retinopathy; NPDR non-proliferative diabetic retinopathy; PDR proliferative diabetic retinopathy; CSME clinically significant macular edema; DME diabetic macular edema; dbh dot blot hemorrhages; CWS cotton wool spot; POAG primary open angle glaucoma; C/D cup-to-disc ratio; HVF humphrey visual field; GVF goldmann visual field; OCT optical coherence tomography; IOP intraocular pressure; BRVO Branch retinal vein occlusion; CRVO central retinal vein occlusion; CRAO central retinal artery occlusion; BRAO branch retinal artery occlusion; RT retinal tear; SB scleral buckle; PPV pars plana vitrectomy; VH Vitreous hemorrhage; PRP panretinal laser photocoagulation; IVK intravitreal kenalog; VMT vitreomacular traction; MH Macular hole;  NVD neovascularization of the disc; NVE neovascularization elsewhere;  AREDS age related eye disease study; ARMD age related macular degeneration; POAG primary open angle glaucoma; EBMD epithelial/anterior basement membrane dystrophy; ACIOL anterior chamber intraocular lens; IOL intraocular lens; PCIOL posterior chamber intraocular lens; Phaco/IOL phacoemulsification with intraocular lens placement;  [place] photorefractive keratectomy; LASIK laser assisted in situ keratomileusis; HTN hypertension; DM diabetes mellitus; COPD chronic obstructive pulmonary disease

## 2021-10-04 ENCOUNTER — Ambulatory Visit (INDEPENDENT_AMBULATORY_CARE_PROVIDER_SITE_OTHER): Payer: BC Managed Care – PPO | Admitting: Ophthalmology

## 2021-10-04 ENCOUNTER — Encounter (INDEPENDENT_AMBULATORY_CARE_PROVIDER_SITE_OTHER): Payer: Self-pay | Admitting: Ophthalmology

## 2021-10-04 DIAGNOSIS — E113513 Type 2 diabetes mellitus with proliferative diabetic retinopathy with macular edema, bilateral: Secondary | ICD-10-CM

## 2021-10-04 DIAGNOSIS — I10 Essential (primary) hypertension: Secondary | ICD-10-CM

## 2021-10-04 DIAGNOSIS — H25813 Combined forms of age-related cataract, bilateral: Secondary | ICD-10-CM

## 2021-10-04 DIAGNOSIS — H35033 Hypertensive retinopathy, bilateral: Secondary | ICD-10-CM | POA: Diagnosis not present

## 2021-10-04 MED ORDER — AFLIBERCEPT 2MG/0.05ML IZ SOLN FOR KALEIDOSCOPE
2.0000 mg | INTRAVITREAL | Status: AC | PRN
  Administered 2021-10-04: 2 mg via INTRAVITREAL

## 2021-10-04 MED ORDER — BEVACIZUMAB CHEMO INJECTION 1.25MG/0.05ML SYRINGE FOR KALEIDOSCOPE
1.2500 mg | INTRAVITREAL | Status: AC | PRN
  Administered 2021-10-04: 1.25 mg via INTRAVITREAL

## 2021-10-05 DIAGNOSIS — Z13220 Encounter for screening for lipoid disorders: Secondary | ICD-10-CM | POA: Diagnosis not present

## 2021-10-05 DIAGNOSIS — Z125 Encounter for screening for malignant neoplasm of prostate: Secondary | ICD-10-CM | POA: Diagnosis not present

## 2021-10-05 DIAGNOSIS — E119 Type 2 diabetes mellitus without complications: Secondary | ICD-10-CM | POA: Diagnosis not present

## 2021-10-05 DIAGNOSIS — Z0000 Encounter for general adult medical examination without abnormal findings: Secondary | ICD-10-CM | POA: Diagnosis not present

## 2021-10-08 DIAGNOSIS — Z0000 Encounter for general adult medical examination without abnormal findings: Secondary | ICD-10-CM | POA: Diagnosis not present

## 2021-10-25 NOTE — Progress Notes (Signed)
[facility] - Clinic Note  11/05/2021    CHIEF COMPLAINT Patient presents for Retina Follow Up  HISTORY OF PRESENT ILLNESS: Jason Bowen is a 61 y.o. male who presents to the clinic today for:    HPI     Retina Follow Up   Patient presents with  Diabetic Retinopathy (IVA OD, IVE OS 07.10.23).  In both eyes.  This started years ago.  Severity is moderate.  Duration of 4.5 weeks.  Since onset it is stable.  I, the attending physician,  performed the HPI with the patient and updated documentation appropriately.        Comments   Patient feels that the vision is the same since his last appointment 4.5 weeks ago. His blood sugar was 110 and his A1C 6.4.      Last edited by Rennis Chris, MD on 11/05/2021  3:22 PM.    Pt states vision is the same, pt states his blood sugar and A1c has been "amazing" on [facility]   Referring physician: No referring provider defined for this encounter.  HISTORICAL INFORMATION:   Selected notes from the MEDICAL RECORD NUMBER Referred by Dr. Allena Katz for eval of DR w/DME OS   CURRENT MEDICATIONS: No current outpatient medications on file. (Ophthalmic Drugs)   No current facility-administered medications for this visit. (Ophthalmic Drugs)   Current Outpatient Medications (Other)  Medication Sig   dapagliflozin propanediol (FARXIGA) 10 MG TABS tablet Take 10 mg by mouth daily.   Insulin Degludec (TRESIBA) 100 UNIT/ML SOLN Inject 36 Units into the skin daily.   insulin lispro (HUMALOG) 100 UNIT/ML injection Inject 15 Units into the skin 3 (three) times daily before meals.   lisinopril (ZESTRIL) 20 MG tablet Take 20 mg by mouth daily.   lisinopril (ZESTRIL) 20 MG tablet 1 tablet (Patient not taking: Reported on 12/09/2020)   metFORMIN (GLUCOPHAGE) 500 MG tablet Take 500 mg by mouth 2 (two) times daily with a meal.   metFORMIN (GLUCOPHAGE-XR) 500 MG 24 hr tablet 1 tablet with evening meal (Patient not taking: Reported on 12/09/2020)    tirzepatide (MOUNJARO) 5 MG/0.5ML Pen Inject 5 mg into the skin once a week.   triamcinolone cream (KENALOG) 0.1 % SMARTSIG:1 Application Topical 2-3 Times Daily   No current facility-administered medications for this visit. (Other)   REVIEW OF SYSTEMS: ROS   Positive for: Endocrine, Eyes Negative for: Constitutional, Gastrointestinal, Neurological, Skin, Genitourinary, Musculoskeletal, HENT, Cardiovascular, Respiratory, Psychiatric, Allergic/Imm, Heme/Lymph Last edited by Julieanne Cotton, COT on 11/05/2021  1:59 PM.     ALLERGIES No Known Allergies  PAST MEDICAL HISTORY Past Medical History:  Diagnosis Date   Cancer (HCC)    Cataract    Diabetic retinopathy (HCC)    Hypertension    Hypertensive retinopathy    Past Surgical History:  Procedure Laterality Date   ACHILLES TENDON SURGERY     ANKLE SURGERY     CARPAL TUNNEL WITH CUBITAL TUNNEL     MOHS SURGERY     has had 20+ surgeries for this on different areas of body   FAMILY HISTORY Family History  Problem Relation Age of Onset   Hypertension Mother    Diabetes Mother    Cataracts Mother    Cancer Paternal Grandmother    SOCIAL HISTORY Social History   Tobacco Use   Smoking status: Never   Smokeless tobacco: Never  Vaping Use   Vaping Use: Never used  Substance Use Topics   Alcohol use: Not Currently  Drug use: Not Currently       OPHTHALMIC EXAM: Base Eye Exam     Visual Acuity (Snellen - Linear)       Right Left   Dist cc 20/25 20/40   Dist ph cc  NI    Correction: Glasses         Tonometry (Tonopen, 2:04 PM)       Right Left   Pressure 15 12         Pupils       Dark Light Shape React APD   Right 3 2 Round Brisk None   Left 3 2 Round Brisk None         Visual Fields       Left Right    Full Full         Extraocular Movement       Right Left    Full, Ortho Full, Ortho         Neuro/Psych     Oriented x3: Yes   Mood/Affect: Normal         Dilation      Both eyes: 1.0% Mydriacyl, 2.5% Phenylephrine @ 2:01 PM           Slit Lamp and Fundus Exam     Slit Lamp Exam       Right Left   Lids/Lashes Dermatochalasis - upper lid Dermatochalasis - upper lid   Conjunctiva/Sclera White and quiet White and quiet   Cornea arcus arcus   Anterior Chamber deep, clear, narrow temporal angle deep, clear, narrow temporal angle   Iris Round and dilated, No NVI Round and dilated, No NVI   Lens Clear Trace Posterior subcapsular cataract   Anterior Vitreous mild syneresis mild syneresis         Fundus Exam       Right Left   Disc Pink and Sharp mild Pallor, Sharp rim   C/D Ratio 0.2 0.2   Macula Flat, blunted foveal reflex, +edema; focal MA/DBH with exudates ST to fovea -- slightly improved, boat shaped subhyalod heme IT to fovea -- almost resolved, barely visible -- good target for focal laser Blunted foveal reflex, circinate exudates and edema temporal macula, scattered MA/DBH - improving -- good targets for focal laser   Vessels mild attenuation, mild tortuosity attenuated, mild tortuousity, scattered, early NV greatest superior to disc--regressing   Periphery Attached, 360 DBH, patches of NVE, good 360 PRP, room for fill in temporally and posteriorly if needed Attached, 360 DBH, patches of NVE regressing, good 360 PRP           Refraction     Wearing Rx       Sphere Cylinder Add   Right +1.75 Sphere +2.50   Left +1.75 Sphere +2.50           IMAGING AND PROCEDURES  Imaging and Procedures for 11/05/2021  OCT, Retina - OU - Both Eyes       Right Eye Quality was good. Central Foveal Thickness: 252. Progression has improved. Findings include no SRF, abnormal foveal contour, intraretinal hyper-reflective material, intraretinal fluid (Mild interval improvement in IRF/IRHM temporal macula ).   Left Eye Quality was good. Central Foveal Thickness: 230. Progression has improved. Findings include no SRF, abnormal foveal contour,  intraretinal hyper-reflective material, intraretinal fluid, vitreomacular adhesion (Mild interval improvement in IRF/IRHM temporal macula ).   Notes *Images captured and stored on drive  Diagnosis / Impression:  +DME OU OD: Mild interval improvement in IRF/IRHM temporal  macula  OS: mild interval improvement in IRF/IRHM temporal macula   Clinical management:  See below  Abbreviations: NFP - Normal foveal profile. CME - cystoid macular edema. PED - pigment epithelial detachment. IRF - intraretinal fluid. SRF - subretinal fluid. EZ - ellipsoid zone. ERM - epiretinal membrane. ORA - outer retinal atrophy. ORT - outer retinal tubulation. SRHM - subretinal hyper-reflective material. IRHM - intraretinal hyper-reflective material      Intravitreal Injection, Pharmacologic Agent - OD - Right Eye       Time Out 11/05/2021. 2:22 PM. Confirmed correct patient, procedure, site, and patient consented.   Anesthesia Topical anesthesia was used. Anesthetic medications included Lidocaine 2%, Proparacaine 0.5%.   Procedure Preparation included 5% betadine to ocular surface, eyelid speculum. A supplied (32g) needle was used.   Injection: 1.25 mg Bevacizumab 1.25mg /0.59ml   Route: Intravitreal, Site: Right Eye   NDC: P3213405, Lot: [id number] , Expiration date: 12/28/2021   Post-op Post injection exam found visual acuity of at least counting fingers. The patient tolerated the procedure well. There were no complications. The patient received written and verbal post procedure care education. Post injection medications were not given.      Intravitreal Injection, Pharmacologic Agent - OS - Left Eye       Time Out 11/05/2021. 2:23 PM. Confirmed correct patient, procedure, site, and patient consented.   Anesthesia Topical anesthesia was used. Anesthetic medications included Lidocaine 2%, Proparacaine 0.5%.   Procedure Preparation included 5% betadine to ocular surface, eyelid speculum. A  (32g) needle was used.   Injection: 2 mg aflibercept 2 MG/0.05ML   Route: Intravitreal, Site: Left Eye   NDC: L6038910, Lot: [id number], Expiration date: 12/26/2022, Waste: 0 mL   Post-op Post injection exam found visual acuity of at least counting fingers. The patient tolerated the procedure well. There were no complications. The patient received written and verbal post procedure care education. Post injection medications were not given.            ASSESSMENT/PLAN:   ICD-10-CM   1. Proliferative diabetic retinopathy of both eyes with macular edema associated with type 2 diabetes mellitus (HCC)  E11.3513 OCT, Retina - OU - Both Eyes    Intravitreal Injection, Pharmacologic Agent - OD - Right Eye    Intravitreal Injection, Pharmacologic Agent - OS - Left Eye    aflibercept (EYLEA) SOLN 2 mg    Bevacizumab (AVASTIN) SOLN 1.25 mg    2. Essential hypertension  I10     3. Hypertensive retinopathy of both eyes  H35.033     4. Combined forms of age-related cataract of both eyes  H25.813      1. Proliferative diabetic retinopathy, both eyes  - FA (07.12.22) shows scattered patches of NVE OU - s/p PRP OD (07.20.22)  - s/p PRP OS (08.03.22)  - s/p IVA OU #1 (08.17.22), #2 (09.14.22), #3 (10.12.22), #4 (11.09.22), #5 (12.21.22), #6 (01.20.23), #7 (02.20.23), #8 (03.29.23), #9 (04.26.23) -- mild IVA resistance OS - s/p IVA OD #10 (05.24.23), #11 (07.10.23)  - s/p IVE OS #1 (05.24.23), #2 (07.10.23)  - history of poor BG control -- now improved on Monjouro  - BCVA OD: 20/25; OS: 20/40 -- stable - exam shows scattered IRH and NVE OU; OD with small subhyaloid heme -- fading, almost resolved - OCT shows OD: Interval improvement in IRF/IRHM temporal macula OU at 4.5 weeks - recommend IVA OD #12 and IVE OS #3 today, 08.11.23 for DME - pt wishes to proceed with injections - RBA of  procedure discussed, questions answered - Avastin, informed consent obtained and signed, 08.17.2022 (OU) -  Eylea, informed consent obtained and signed, 05.24.23 (OS) - see procedure note - f/u August 21 -- DFE/OCT, focal laser OD  2,3. Hypertensive retinopathy OU - discussed importance of tight BP control - monitor  4.   Mixed Cataract OU - The symptoms of cataract, surgical options, and treatments and risks were discussed with patient. - discussed diagnosis and progression - not yet visually significant - monitor for now  Ophthalmic Meds Ordered this visit:  Meds ordered this encounter  Medications   aflibercept (EYLEA) SOLN 2 mg   Bevacizumab (AVASTIN) SOLN 1.25 mg     Return in about 10 days (around 11/15/2021) for f/u PDR OU, DFE, OCT, focal laser OD.  There are no Patient Instructions on file for this visit.  This document serves as a record of services personally performed by Karie Chimera, MD, PhD. It was created on their behalf by Gerilyn Nestle, COT an ophthalmic technician. The creation of this record is the provider's dictation and/or activities during the visit.    Electronically signed by:  Gerilyn Nestle, COT  10/25/21 12:46 AM  This document serves as a record of services personally performed by Karie Chimera, MD, PhD. It was created on their behalf by Glee Arvin. Manson Passey, OA an ophthalmic technician. The creation of this record is the provider's dictation and/or activities during the visit.    Electronically signed by: Glee Arvin. Manson Passey, [place] 08.11.2023 12:46 AM  Karie Chimera, M.D., Ph.D. Diseases & Surgery of the Retina and Vitreous [facility]  I have reviewed the above documentation for accuracy and completeness, and I agree with the above. Karie Chimera, M.D., Ph.D. 11/07/21 12:48 AM   Abbreviations: M myopia (nearsighted); A astigmatism; H hyperopia (farsighted); P presbyopia; Mrx spectacle prescription;  CTL contact lenses; OD right eye; OS left eye; OU both eyes  XT exotropia; ET esotropia; PEK punctate epithelial keratitis;  PEE punctate epithelial erosions; DES dry eye syndrome; MGD meibomian gland dysfunction; ATs artificial tears; PFAT's preservative free artificial tears; NSC nuclear sclerotic cataract; PSC posterior subcapsular cataract; ERM epi-retinal membrane; PVD posterior vitreous detachment; RD retinal detachment; DM diabetes mellitus; DR diabetic retinopathy; NPDR non-proliferative diabetic retinopathy; PDR proliferative diabetic retinopathy; CSME clinically significant macular edema; DME diabetic macular edema; dbh dot blot hemorrhages; CWS cotton wool spot; POAG primary open angle glaucoma; C/D cup-to-disc ratio; HVF humphrey visual field; GVF goldmann visual field; OCT optical coherence tomography; IOP intraocular pressure; BRVO Branch retinal vein occlusion; CRVO central retinal vein occlusion; CRAO central retinal artery occlusion; BRAO branch retinal artery occlusion; RT retinal tear; SB scleral buckle; PPV pars plana vitrectomy; VH Vitreous hemorrhage; PRP panretinal laser photocoagulation; IVK intravitreal kenalog; VMT vitreomacular traction; MH Macular hole;  NVD neovascularization of the disc; NVE neovascularization elsewhere; AREDS age related eye disease study; ARMD age related macular degeneration; POAG primary open angle glaucoma; EBMD epithelial/anterior basement membrane dystrophy; ACIOL anterior chamber intraocular lens; IOL intraocular lens; PCIOL posterior chamber intraocular lens; Phaco/IOL phacoemulsification with intraocular lens placement; PRK photorefractive keratectomy; LASIK laser assisted in situ keratomileusis; HTN hypertension; DM diabetes mellitus; COPD chronic obstructive pulmonary disease

## 2021-11-05 ENCOUNTER — Ambulatory Visit (INDEPENDENT_AMBULATORY_CARE_PROVIDER_SITE_OTHER): Payer: BC Managed Care – PPO | Admitting: Ophthalmology

## 2021-11-05 ENCOUNTER — Encounter (INDEPENDENT_AMBULATORY_CARE_PROVIDER_SITE_OTHER): Payer: Self-pay | Admitting: Ophthalmology

## 2021-11-05 DIAGNOSIS — H35033 Hypertensive retinopathy, bilateral: Secondary | ICD-10-CM | POA: Diagnosis not present

## 2021-11-05 DIAGNOSIS — I10 Essential (primary) hypertension: Secondary | ICD-10-CM

## 2021-11-05 DIAGNOSIS — H25813 Combined forms of age-related cataract, bilateral: Secondary | ICD-10-CM | POA: Diagnosis not present

## 2021-11-05 DIAGNOSIS — E113513 Type 2 diabetes mellitus with proliferative diabetic retinopathy with macular edema, bilateral: Secondary | ICD-10-CM

## 2021-11-05 MED ORDER — BEVACIZUMAB CHEMO INJECTION 1.25MG/0.05ML SYRINGE FOR KALEIDOSCOPE
1.2500 mg | INTRAVITREAL | Status: AC | PRN
  Administered 2021-11-05: 1.25 mg via INTRAVITREAL

## 2021-11-05 MED ORDER — AFLIBERCEPT 2MG/0.05ML IZ SOLN FOR KALEIDOSCOPE
2.0000 mg | INTRAVITREAL | Status: AC | PRN
  Administered 2021-11-05: 2 mg via INTRAVITREAL

## 2021-11-11 NOTE — Progress Notes (Signed)
[facility] Note  11/15/2021    CHIEF COMPLAINT Patient presents for Retina Follow Up  HISTORY OF PRESENT ILLNESS: Jason Bowen is a 61 y.o. male who presents to the clinic today for:    HPI     Retina Follow Up   Patient presents with  Diabetic Retinopathy.  In both eyes.  Severity is moderate.  Duration of 10 days.  Since onset it is stable.  I, the attending physician,  performed the HPI with the patient and updated documentation appropriately.        Comments   Patient states vision the same OU. BS was 97 before lunch today. Last a1c was 6.4, checked 2 months ago.       Last edited by [name], MD on 11/15/2021  3:27 PM.    Pt here for focal laser OD today   Referring physician: No referring provider defined for this encounter.  HISTORICAL INFORMATION:   Selected notes from the MEDICAL RECORD NUMBER Referred by [name] for eval of DR w/DME OS   CURRENT MEDICATIONS: No current outpatient medications on file. (Ophthalmic Drugs)   No current facility-administered medications for this visit. (Ophthalmic Drugs)   Current Outpatient Medications (Other)  Medication Sig   dapagliflozin propanediol (FARXIGA) 10 MG TABS tablet Take 10 mg by mouth daily.   Insulin Degludec (TRESIBA) 100 UNIT/ML SOLN Inject 36 Units into the skin daily.   insulin lispro (HUMALOG) 100 UNIT/ML injection Inject 15 Units into the skin 3 (three) times daily before meals.   lisinopril (ZESTRIL) 20 MG tablet Take 20 mg by mouth daily.   metFORMIN (GLUCOPHAGE) 500 MG tablet Take 500 mg by mouth 2 (two) times daily with a meal.   tirzepatide (MOUNJARO) 5 MG/0.5ML Pen Inject 5 mg into the skin once a week.   triamcinolone cream (KENALOG) 0.1 % SMARTSIG:1 Application Topical 2-3 Times Daily   lisinopril (ZESTRIL) 20 MG tablet 1 tablet (Patient not taking: Reported on 12/09/2020)   metFORMIN (GLUCOPHAGE-XR) 500 MG 24 hr tablet 1 tablet with evening meal (Patient not  taking: Reported on 12/09/2020)   No current facility-administered medications for this visit. (Other)   REVIEW OF SYSTEMS: ROS   Positive for: Endocrine, Eyes Negative for: Constitutional, Gastrointestinal, Neurological, Skin, Genitourinary, Musculoskeletal, HENT, Cardiovascular, Respiratory, Psychiatric, Allergic/Imm, Heme/Lymph Last edited by Jobe Marker, COT on 11/15/2021  2:51 PM.      ALLERGIES No Known Allergies  PAST MEDICAL HISTORY Past Medical History:  Diagnosis Date   Cancer ([place])    Cataract    Diabetic retinopathy ([place])    Hypertension    Hypertensive retinopathy    Past Surgical History:  Procedure Laterality Date   ACHILLES TENDON SURGERY     ANKLE SURGERY     CARPAL TUNNEL WITH CUBITAL TUNNEL     MOHS SURGERY     has had 20+ surgeries for this on different areas of body   FAMILY HISTORY Family History  Problem Relation Age of Onset   Hypertension Mother    Diabetes Mother    Cataracts Mother    Cancer Paternal Grandmother    SOCIAL HISTORY Social History   Tobacco Use   Smoking status: Never   Smokeless tobacco: Never  Vaping Use   Vaping Use: Never used  Substance Use Topics   Alcohol use: Not Currently   Drug use: Not Currently       OPHTHALMIC EXAM: Base Eye Exam     Visual Acuity (  Snellen - Linear)       Right Left   Dist cc 20/25 20/40 -2   Dist ph cc  20/30 -2    Correction: Glasses         Tonometry (Tonopen, 2:58 PM)       Right Left   Pressure 13 18         Pupils       Dark Light Shape React APD   Right 3 2 Round Brisk None   Left 3 2 Round Brisk None         Visual Fields (Counting fingers)       Left Right    Full Full         Extraocular Movement       Right Left    Full, Ortho Full, Ortho         Neuro/Psych     Oriented x3: Yes   Mood/Affect: Normal         Dilation     Right eye: 1.0% Mydriacyl, 2.5% Phenylephrine @ 2:59 PM           Slit Lamp and Fundus Exam      Slit Lamp Exam       Right Left   Lids/Lashes Dermatochalasis - upper lid Dermatochalasis - upper lid   Conjunctiva/Sclera White and quiet White and quiet   Cornea arcus arcus   Anterior Chamber deep, clear, narrow temporal angle deep, clear, narrow temporal angle   Iris Round and dilated, No NVI Round and dilated, No NVI   Lens Clear Trace Posterior subcapsular cataract   Anterior Vitreous mild syneresis mild syneresis         Fundus Exam       Right Left   Disc Pink and Sharp mild Pallor, Sharp rim   C/D Ratio 0.2 0.2   Macula Flat, blunted foveal reflex, +edema; focal MA/DBH with exudates ST to fovea -- good targets for focal laser; boat shaped subhyalod heme IT to fovea -- almost resolved, barely visible Blunted foveal reflex, circinate exudates and edema temporal macula, scattered MA/DBH - improving -- good targets for focal laser   Vessels mild attenuation, mild tortuosity attenuated, mild tortuousity, scattered, early NV greatest superior to disc--regressing   Periphery Attached, 360 DBH, patches of NVE, good 360 PRP, room for fill in temporally and posteriorly if needed Attached, 360 DBH, patches of NVE regressing, good 360 PRP           Refraction     Wearing Rx       Sphere Cylinder Add   Right +1.75 Sphere +2.50   Left +1.75 Sphere +2.50           IMAGING AND PROCEDURES  Imaging and Procedures for 11/15/2021  OCT, Retina - OU - Both Eyes       Right Eye Quality was good. Central Foveal Thickness: 253. Progression has improved. Findings include no SRF, abnormal foveal contour, intraretinal hyper-reflective material, intraretinal fluid, vitreomacular adhesion (Mild interval improvement in IRF/IRHM temporal macula ).   Left Eye Quality was good. Central Foveal Thickness: 230. Progression has been stable. Findings include no SRF, abnormal foveal contour, intraretinal hyper-reflective material, intraretinal fluid, vitreomacular adhesion (persistent IRF/IRHM  temporal macula -- slightly improved).   Notes *Images captured and stored on drive  Diagnosis / Impression:  +DME OU OD: Mild interval improvement in focal IRF/IRHM temporal macula  OS: persistent IRF/IRHM temporal macula -- slightly improved  Clinical management:  See below  Abbreviations: NFP - Normal foveal profile. CME - cystoid macular edema. PED - pigment epithelial detachment. IRF - intraretinal fluid. SRF - subretinal fluid. EZ - ellipsoid zone. ERM - epiretinal membrane. ORA - outer retinal atrophy. ORT - outer retinal tubulation. SRHM - subretinal hyper-reflective material. IRHM - intraretinal hyper-reflective material      Focal Laser - OD - Right Eye       LASER PROCEDURE NOTE  Diagnosis:   Diabetic macular edema, RIGHT EYE  Procedure:  Focal laser photocoagulation using slit lamp laser, RIGHT EYE  Anesthesia:  Topical  Surgeon: [name], MD, PhD   Informed consent obtained, operative eye marked, and time out performed prior to initiation of laser.   Lumenis [id number] Focal/Grid laser Power: 95 mW Duration: 50 msec  Spot size: 100 microns  # spots: 73 spots placed to MAs temporal to fovea  Complications: None.  RTC: Sept 8 or later  Patient tolerated the procedure well and received written and verbal post-procedure care information/education.           ASSESSMENT/PLAN:   ICD-10-CM   1. Proliferative diabetic retinopathy of both eyes with macular edema associated with type 2 diabetes mellitus (HCC)  E11.3513 OCT, Retina - OU - Both Eyes    Focal Laser - OD - Right Eye    2. Essential hypertension  I10     3. Hypertensive retinopathy of both eyes  H35.033     4. Combined forms of age-related cataract of both eyes  H25.813      1. Proliferative diabetic retinopathy, both eyes  - FA (07.12.22) shows scattered patches of NVE OU - s/p PRP OD (07.20.22)  - s/p PRP OS (08.03.22)  - s/p IVA OU #1 (08.17.22), #2 (09.14.22), #3 (10.12.22), #4  (11.09.22), #5 (12.21.22), #6 (01.20.23), #7 (02.20.23), #8 (03.29.23), #9 (04.26.23) -- mild IVA resistance OS - s/p IVA OD #10 (05.24.23), #11 (07.10.23), #12 (08.11.23)  - s/p IVE OS #1 (05.24.23), #2 (07.10.23), #3 (08.11.23)  - history of poor BG control -- now improved on Monjouro  - BCVA OD: 20/25; OS: 20/30 -- improved - exam shows scattered IRH and NVE OU; OD with small subhyaloid heme -- fading, almost resolved - OCT shows mild Interval improvement in IRF/IRHM temporal macula OU - recommend focal laser OD today, 08.21.23 for DME - pt wishes to proceed with injections - RBA of procedure discussed, questions answered - Avastin, informed consent obtained and signed, 08.17.2022 (OU) - Eylea, informed consent obtained and signed, 05.24.23 (OS) - see procedure note - f/u September 8 or later -- DFE/OCT  2,3. Hypertensive retinopathy OU - discussed importance of tight BP control - monitor  4.   Mixed Cataract OU - The symptoms of cataract, surgical options, and treatments and risks were discussed with patient. - discussed diagnosis and progression - not yet visually significant - monitor for now  Ophthalmic Meds Ordered this visit:  No orders of the defined types were placed in this encounter.    Return in about 18 days (around 12/03/2021) for Sept 8 or later - Dilated Exam, OCT, Possible Injxn.  There are no Patient Instructions on file for this visit.  This document serves as a record of services personally performed by Gardiner Sleeper, MD, PhD. It was created on their behalf by San Jetty. Owens Shark, OA an ophthalmic technician. The creation of this record is the provider's dictation and/or activities during the visit.    Electronically signed by: San Jetty. Owens Shark, [place] 08.17.2023 3:37  PM  Gardiner Sleeper, M.D., Ph.D. Diseases & Surgery of the Retina and Vitreous [facility]  I have reviewed the above documentation for accuracy and completeness, and I agree  with the above. Gardiner Sleeper, M.D., Ph.D. 11/15/21 3:37 PM   Abbreviations: M myopia (nearsighted); A astigmatism; H hyperopia (farsighted); P presbyopia; Mrx spectacle prescription;  CTL contact lenses; OD right eye; OS left eye; OU both eyes  XT exotropia; ET esotropia; PEK punctate epithelial keratitis; PEE punctate epithelial erosions; DES dry eye syndrome; MGD meibomian gland dysfunction; ATs artificial tears; PFAT's preservative free artificial tears; [place] nuclear sclerotic cataract; PSC posterior subcapsular cataract; ERM epi-retinal membrane; PVD posterior vitreous detachment; RD retinal detachment; DM diabetes mellitus; DR diabetic retinopathy; NPDR non-proliferative diabetic retinopathy; PDR proliferative diabetic retinopathy; CSME clinically significant macular edema; DME diabetic macular edema; dbh dot blot hemorrhages; CWS cotton wool spot; POAG primary open angle glaucoma; C/D cup-to-disc ratio; HVF humphrey visual field; GVF goldmann visual field; OCT optical coherence tomography; IOP intraocular pressure; BRVO Branch retinal vein occlusion; CRVO central retinal vein occlusion; CRAO central retinal artery occlusion; BRAO branch retinal artery occlusion; RT retinal tear; SB scleral buckle; PPV pars plana vitrectomy; VH Vitreous hemorrhage; PRP panretinal laser photocoagulation; IVK intravitreal kenalog; VMT vitreomacular traction; MH Macular hole;  NVD neovascularization of the disc; NVE neovascularization elsewhere; AREDS age related eye disease study; ARMD age related macular degeneration; POAG primary open angle glaucoma; EBMD epithelial/anterior basement membrane dystrophy; ACIOL anterior chamber intraocular lens; IOL intraocular lens; PCIOL posterior chamber intraocular lens; Phaco/IOL phacoemulsification with intraocular lens placement; [place] photorefractive keratectomy; LASIK laser assisted in situ keratomileusis; HTN hypertension; DM diabetes mellitus; COPD chronic obstructive pulmonary  disease

## 2021-11-15 ENCOUNTER — Ambulatory Visit (INDEPENDENT_AMBULATORY_CARE_PROVIDER_SITE_OTHER): Payer: BC Managed Care – PPO | Admitting: Ophthalmology

## 2021-11-15 ENCOUNTER — Encounter (INDEPENDENT_AMBULATORY_CARE_PROVIDER_SITE_OTHER): Payer: Self-pay | Admitting: Ophthalmology

## 2021-11-15 DIAGNOSIS — E113513 Type 2 diabetes mellitus with proliferative diabetic retinopathy with macular edema, bilateral: Secondary | ICD-10-CM | POA: Diagnosis not present

## 2021-11-15 DIAGNOSIS — I10 Essential (primary) hypertension: Secondary | ICD-10-CM | POA: Diagnosis not present

## 2021-11-15 DIAGNOSIS — H25813 Combined forms of age-related cataract, bilateral: Secondary | ICD-10-CM

## 2021-11-15 DIAGNOSIS — H35033 Hypertensive retinopathy, bilateral: Secondary | ICD-10-CM | POA: Diagnosis not present

## 2021-11-19 NOTE — Progress Notes (Signed)
[facility] Note  12/03/2021    CHIEF COMPLAINT Patient presents for Retina Follow Up  HISTORY OF PRESENT ILLNESS: Jason Bowen is a 61 y.o. male who presents to the clinic today for:    HPI     Retina Follow Up   Patient presents with  Diabetic Retinopathy.  In both eyes.  This started 2.5 weeks ago.  I, the attending physician,  performed the HPI with the patient and updated documentation appropriately.        Comments   Patient here for 2.5 weeks retina follow up for PDR OU. Patient states vision going good. No eye pain.       Last edited by [name], MD on 12/03/2021  4:28 PM.    Pt states he had no problems with the laser procedure at last visit  Referring physician: No referring provider defined for this encounter.  HISTORICAL INFORMATION:   Selected notes from the MEDICAL RECORD NUMBER Referred by [name] for eval of DR w/DME OS   CURRENT MEDICATIONS: No current outpatient medications on file. (Ophthalmic Drugs)   No current facility-administered medications for this visit. (Ophthalmic Drugs)   Current Outpatient Medications (Other)  Medication Sig   dapagliflozin propanediol (FARXIGA) 10 MG TABS tablet Take 10 mg by mouth daily.   Insulin Degludec (TRESIBA) 100 UNIT/ML SOLN Inject 36 Units into the skin daily.   insulin lispro (HUMALOG) 100 UNIT/ML injection Inject 15 Units into the skin 3 (three) times daily before meals.   lisinopril (ZESTRIL) 20 MG tablet Take 20 mg by mouth daily.   metFORMIN (GLUCOPHAGE) 500 MG tablet Take 500 mg by mouth 2 (two) times daily with a meal.   tirzepatide (MOUNJARO) 5 MG/0.5ML Pen Inject 5 mg into the skin once a week.   triamcinolone cream (KENALOG) 0.1 % SMARTSIG:1 Application Topical 2-3 Times Daily   lisinopril (ZESTRIL) 20 MG tablet 1 tablet (Patient not taking: Reported on 12/09/2020)   metFORMIN (GLUCOPHAGE-XR) 500 MG 24 hr tablet 1 tablet with evening meal (Patient not taking:  Reported on 12/09/2020)   No current facility-administered medications for this visit. (Other)   REVIEW OF SYSTEMS: ROS   Positive for: Endocrine, Eyes Negative for: Constitutional, Gastrointestinal, Neurological, Skin, Genitourinary, Musculoskeletal, HENT, Cardiovascular, Respiratory, Psychiatric, Allergic/Imm, Heme/Lymph Last edited by Theodore Demark, COA on 12/03/2021  2:24 PM.     ALLERGIES No Known Allergies  PAST MEDICAL HISTORY Past Medical History:  Diagnosis Date   Cancer ([place])    Cataract    Diabetic retinopathy ([place])    Hypertension    Hypertensive retinopathy    Past Surgical History:  Procedure Laterality Date   ACHILLES TENDON SURGERY     ANKLE SURGERY     CARPAL TUNNEL WITH CUBITAL TUNNEL     MOHS SURGERY     has had 20+ surgeries for this on different areas of body   FAMILY HISTORY Family History  Problem Relation Age of Onset   Hypertension Mother    Diabetes Mother    Cataracts Mother    Cancer Paternal Grandmother    SOCIAL HISTORY Social History   Tobacco Use   Smoking status: Never   Smokeless tobacco: Never  Vaping Use   Vaping Use: Never used  Substance Use Topics   Alcohol use: Not Currently   Drug use: Not Currently       OPHTHALMIC EXAM: Base Eye Exam     Visual Acuity (Snellen - Linear)  Right Left   Dist cc 20/20 -1 20/40   Dist ph cc  20/30 -2    Correction: Glasses         Tonometry (Tonopen, 2:21 PM)       Right Left   Pressure 18 17         Pupils       Dark Light Shape React APD   Right 3 2 Round Brisk None   Left 3 2 Round Brisk None         Visual Fields (Counting fingers)       Left Right    Full Full         Extraocular Movement       Right Left    Full, Ortho Full, Ortho         Neuro/Psych     Oriented x3: Yes   Mood/Affect: Normal         Dilation     Both eyes: 1.0% Mydriacyl, 2.5% Phenylephrine @ 2:21 PM           Slit Lamp and Fundus Exam     Slit Lamp  Exam       Right Left   Lids/Lashes Dermatochalasis - upper lid Dermatochalasis - upper lid   Conjunctiva/Sclera White and quiet White and quiet   Cornea arcus arcus   Anterior Chamber deep, clear, narrow temporal angle deep, clear, narrow temporal angle   Iris Round and dilated, No NVI Round and dilated, No NVI   Lens Clear Trace Posterior subcapsular cataract   Anterior Vitreous mild syneresis mild syneresis         Fundus Exam       Right Left   Disc Pink and Sharp mild Pallor, Sharp rim   C/D Ratio 0.2 0.2   Macula Flat, blunted foveal reflex, +edema -- improved; focal MA/DBH with exudates ST to fovea --improved with good focal laser changes; boat shaped subhyalod heme IT to fovea -- almost resolved, barely visible Blunted foveal reflex, circinate exudates and edema temporal macula, scattered MA/DBH - improving -- good targets for focal laser   Vessels mild attenuation, mild tortuosity attenuated, mild tortuousity, scattered, early NV greatest superior to disc--regressing   Periphery Attached, 360 DBH, patches of NVE, good 360 PRP, room for fill in temporally and posteriorly if needed Attached, 360 DBH, patches of NVE regressing, good 360 PRP           Refraction     Wearing Rx       Sphere Cylinder Add   Right +1.75 Sphere +2.50   Left +1.75 Sphere +2.50           IMAGING AND PROCEDURES  Imaging and Procedures for 12/03/2021  OCT, Retina - OU - Both Eyes       Right Eye Quality was good. Central Foveal Thickness: 249. Progression has improved. Findings include normal foveal contour, no SRF, abnormal foveal contour, intraretinal hyper-reflective material, intraretinal fluid, vitreomacular adhesion (Mild interval improvement in IRF/IRHM temporal macula ).   Left Eye Quality was good. Central Foveal Thickness: 228. Progression has been stable. Findings include no SRF, abnormal foveal contour, intraretinal hyper-reflective material, intraretinal fluid, vitreomacular  adhesion (persistent IRF/IRHM temporal macula ).   Notes *Images captured and stored on drive  Diagnosis / Impression:  +DME OU OD: Mild interval improvement in focal IRF/IRHM temporal macula  OS: persistent IRF/IRHM temporal macula   Clinical management:  See below  Abbreviations: NFP - Normal foveal profile. CME -  cystoid macular edema. PED - pigment epithelial detachment. IRF - intraretinal fluid. SRF - subretinal fluid. EZ - ellipsoid zone. ERM - epiretinal membrane. ORA - outer retinal atrophy. ORT - outer retinal tubulation. SRHM - subretinal hyper-reflective material. IRHM - intraretinal hyper-reflective material      Intravitreal Injection, Pharmacologic Agent - OS - Left Eye       Time Out 12/03/2021. 3:08 PM. Confirmed correct patient, procedure, site, and patient consented.   Anesthesia Topical anesthesia was used. Anesthetic medications included Lidocaine 2%, Proparacaine 0.5%.   Procedure Preparation included 5% betadine to ocular surface, eyelid speculum. A (32g) needle was used.   Injection: 2 mg aflibercept 2 MG/0.05ML   Route: Intravitreal, Site: Left Eye   NDC: [id number], Lot: [id number], Expiration date: 12/26/2022, Waste: 0 mL   Post-op Post injection exam found visual acuity of at least counting fingers. The patient tolerated the procedure well. There were no complications. The patient received written and verbal post procedure care education. Post injection medications were not given.            ASSESSMENT/PLAN:   ICD-10-CM   1. Proliferative diabetic retinopathy of both eyes with macular edema associated with type 2 diabetes mellitus (HCC)  E11.3513 OCT, Retina - OU - Both Eyes    Intravitreal Injection, Pharmacologic Agent - OS - Left Eye    aflibercept (EYLEA) SOLN 2 mg    2. Essential hypertension  I10     3. Hypertensive retinopathy of both eyes  H35.033     4. Combined forms of age-related cataract of both eyes  H25.813      1.  Proliferative diabetic retinopathy, both eyes  - FA (07.12.22) shows scattered patches of NVE OU - s/p PRP OD (07.20.22)  - s/p PRP OS (08.03.22)  - s/p IVA OU #1 (08.17.22), #2 (09.14.22), #3 (10.12.22), #4 (11.09.22), #5 (12.21.22), #6 (01.20.23), #7 (02.20.23), #8 (03.29.23), #9 (04.26.23) -- mild IVA resistance OS - s/p IVA OD #10 (05.24.23), #11 (07.10.23), #12 (08.11.23)  - s/p IVE OS #1 (05.24.23), #2 (07.10.23), #3 (08.11.23)  - s/p focal laser OD (08.21.23)  - history of poor BG control -- now improved on Monjouro  - BCVA OD: 20/20 from 20/25; OS: 20/30 -- stable - OCT shows mild Interval improvement in IRF/IRHM temporal macula OU - recommend IVE OS #4 today, 09.08.23 for DME - will hold OD today - pt in agreement - RBA of procedure discussed, questions answered - Avastin, informed consent obtained and signed, 08.17.2022 (OU) - Eylea, informed consent obtained and signed, 05.24.23 (OS) - see procedure note - f/u 4 weeks -- DFE/OCT  2,3. Hypertensive retinopathy OU - discussed importance of tight BP control - monitor  4.   Mixed Cataract OU - The symptoms of cataract, surgical options, and treatments and risks were discussed with patient. - discussed diagnosis and progression - monitor  Ophthalmic Meds Ordered this visit:  Meds ordered this encounter  Medications   aflibercept (EYLEA) SOLN 2 mg     Return in about 4 weeks (around 12/31/2021) for f/u PDR OU, DFE, OCT.  There are no Patient Instructions on file for this visit.  This document serves as a record of services personally performed by Gardiner Sleeper, MD, PhD. It was created on their behalf by Renaldo Reel, [place] an ophthalmic technician. The creation of this record is the provider's dictation and/or activities during the visit.    Electronically signed by:  Renaldo Reel, COT  11/19/21  1:23 AM  This document serves as a record of services personally performed by Gardiner Sleeper, MD, PhD. It was  created on their behalf by San Jetty. Owens Shark, OA an ophthalmic technician. The creation of this record is the provider's dictation and/or activities during the visit.    Electronically signed by: San Jetty. Owens Shark, [place] 09.08.2023 1:23 AM  Gardiner Sleeper, M.D., Ph.D. Diseases & Surgery of the Retina and Vitreous [facility]  I have reviewed the above documentation for accuracy and completeness, and I agree with the above. Gardiner Sleeper, M.D., Ph.D. 12/04/21 1:25 AM  Abbreviations: M myopia (nearsighted); A astigmatism; H hyperopia (farsighted); P presbyopia; Mrx spectacle prescription;  CTL contact lenses; OD right eye; OS left eye; OU both eyes  XT exotropia; ET esotropia; PEK punctate epithelial keratitis; PEE punctate epithelial erosions; DES dry eye syndrome; MGD meibomian gland dysfunction; ATs artificial tears; PFAT's preservative free artificial tears; [place] nuclear sclerotic cataract; PSC posterior subcapsular cataract; ERM epi-retinal membrane; PVD posterior vitreous detachment; RD retinal detachment; DM diabetes mellitus; DR diabetic retinopathy; NPDR non-proliferative diabetic retinopathy; PDR proliferative diabetic retinopathy; CSME clinically significant macular edema; DME diabetic macular edema; dbh dot blot hemorrhages; CWS cotton wool spot; POAG primary open angle glaucoma; C/D cup-to-disc ratio; HVF humphrey visual field; GVF goldmann visual field; OCT optical coherence tomography; IOP intraocular pressure; BRVO Branch retinal vein occlusion; CRVO central retinal vein occlusion; CRAO central retinal artery occlusion; BRAO branch retinal artery occlusion; RT retinal tear; SB scleral buckle; PPV pars plana vitrectomy; VH Vitreous hemorrhage; PRP panretinal laser photocoagulation; IVK intravitreal kenalog; VMT vitreomacular traction; MH Macular hole;  NVD neovascularization of the disc; NVE neovascularization elsewhere; AREDS age related eye disease study; ARMD age related  macular degeneration; POAG primary open angle glaucoma; EBMD epithelial/anterior basement membrane dystrophy; ACIOL anterior chamber intraocular lens; IOL intraocular lens; PCIOL posterior chamber intraocular lens; Phaco/IOL phacoemulsification with intraocular lens placement; [place] photorefractive keratectomy; LASIK laser assisted in situ keratomileusis; HTN hypertension; DM diabetes mellitus; COPD chronic obstructive pulmonary disease

## 2021-12-03 ENCOUNTER — Ambulatory Visit (INDEPENDENT_AMBULATORY_CARE_PROVIDER_SITE_OTHER): Payer: BC Managed Care – PPO | Admitting: Ophthalmology

## 2021-12-03 ENCOUNTER — Encounter (INDEPENDENT_AMBULATORY_CARE_PROVIDER_SITE_OTHER): Payer: Self-pay | Admitting: Ophthalmology

## 2021-12-03 DIAGNOSIS — I10 Essential (primary) hypertension: Secondary | ICD-10-CM | POA: Diagnosis not present

## 2021-12-03 DIAGNOSIS — E113513 Type 2 diabetes mellitus with proliferative diabetic retinopathy with macular edema, bilateral: Secondary | ICD-10-CM

## 2021-12-03 DIAGNOSIS — H25813 Combined forms of age-related cataract, bilateral: Secondary | ICD-10-CM | POA: Diagnosis not present

## 2021-12-03 DIAGNOSIS — H35033 Hypertensive retinopathy, bilateral: Secondary | ICD-10-CM

## 2021-12-03 MED ORDER — AFLIBERCEPT 2MG/0.05ML IZ SOLN FOR KALEIDOSCOPE
2.0000 mg | INTRAVITREAL | Status: AC | PRN
  Administered 2021-12-03: 2 mg via INTRAVITREAL

## 2021-12-31 NOTE — Progress Notes (Signed)
[facility] Note  01/03/2022    CHIEF COMPLAINT Patient presents for Retina Follow Up  HISTORY OF PRESENT ILLNESS: Jason Bowen is a 61 y.o. male who presents to the clinic today for:    HPI     Retina Follow Up   Patient presents with  Diabetic Retinopathy.  In both eyes.  This started months ago.  Duration of 4 weeks.  Since onset it is stable.  I, the attending physician,  performed the HPI with the patient and updated documentation appropriately.        Comments   Patient feels that the vision is stable at this time and has not noticed any new issues. His blood sugar was 99 and his A1C is 5.9.      Last edited by [name], MD on 01/03/2022  5:23 PM.    Pt states his A1c is down to 5.9, he has not noticed any change in vision  Referring physician: No referring provider defined for this encounter.  HISTORICAL INFORMATION:   Selected notes from the MEDICAL RECORD NUMBER Referred by [name] for eval of DR w/DME OS   CURRENT MEDICATIONS: No current outpatient medications on file. (Ophthalmic Drugs)   No current facility-administered medications for this visit. (Ophthalmic Drugs)   Current Outpatient Medications (Other)  Medication Sig   dapagliflozin propanediol (FARXIGA) 10 MG TABS tablet Take 10 mg by mouth daily.   Insulin Degludec (TRESIBA) 100 UNIT/ML SOLN Inject 36 Units into the skin daily.   insulin lispro (HUMALOG) 100 UNIT/ML injection Inject 15 Units into the skin 3 (three) times daily before meals.   lisinopril (ZESTRIL) 20 MG tablet Take 20 mg by mouth daily.   lisinopril (ZESTRIL) 20 MG tablet 1 tablet (Patient not taking: Reported on 12/09/2020)   metFORMIN (GLUCOPHAGE) 500 MG tablet Take 500 mg by mouth 2 (two) times daily with a meal.   metFORMIN (GLUCOPHAGE-XR) 500 MG 24 hr tablet 1 tablet with evening meal (Patient not taking: Reported on 12/09/2020)   tirzepatide (MOUNJARO) 5 MG/0.5ML Pen Inject 5 mg into the skin  once a week.   triamcinolone cream (KENALOG) 0.1 % SMARTSIG:1 Application Topical 2-3 Times Daily   No current facility-administered medications for this visit. (Other)   REVIEW OF SYSTEMS: ROS   Positive for: Endocrine, Eyes Negative for: Constitutional, Gastrointestinal, Neurological, Skin, Genitourinary, Musculoskeletal, HENT, Cardiovascular, Respiratory, Psychiatric, Allergic/Imm, Heme/Lymph Last edited by Annie Paras, COT on 01/03/2022  2:16 PM.     ALLERGIES No Known Allergies  PAST MEDICAL HISTORY Past Medical History:  Diagnosis Date   Cancer ([place])    Cataract    Diabetic retinopathy ([place])    Hypertension    Hypertensive retinopathy    Past Surgical History:  Procedure Laterality Date   ACHILLES TENDON SURGERY     ANKLE SURGERY     CARPAL TUNNEL WITH CUBITAL TUNNEL     MOHS SURGERY     has had 20+ surgeries for this on different areas of body   FAMILY HISTORY Family History  Problem Relation Age of Onset   Hypertension Mother    Diabetes Mother    Cataracts Mother    Cancer Paternal Grandmother    SOCIAL HISTORY Social History   Tobacco Use   Smoking status: Never   Smokeless tobacco: Never  Vaping Use   Vaping Use: Never used  Substance Use Topics   Alcohol use: Not Currently   Drug use: Not Currently  OPHTHALMIC EXAM: Base Eye Exam     Visual Acuity (Snellen - Linear)       Right Left   Dist cc 20/25 +2 20/40 +1   Dist ph cc  NI    Correction: Glasses         Tonometry (Tonopen, 2:21 PM)       Right Left   Pressure 15 13         Pupils       Dark Light Shape React APD   Right 3 2 Round Brisk None   Left 3 2 Round Brisk None         Visual Fields       Left Right    Full Full         Extraocular Movement       Right Left    Full, Ortho Full, Ortho         Neuro/Psych     Oriented x3: Yes   Mood/Affect: Normal         Dilation     Both eyes: 1.0% Mydriacyl, 2.5% Phenylephrine @ 2:16 PM            Slit Lamp and Fundus Exam     Slit Lamp Exam       Right Left   Lids/Lashes Dermatochalasis - upper lid Dermatochalasis - upper lid   Conjunctiva/Sclera White and quiet White and quiet   Cornea arcus arcus   Anterior Chamber deep, clear, narrow temporal angle deep, clear, narrow temporal angle   Iris Round and dilated, No NVI Round and dilated, No NVI   Lens Clear Trace Posterior subcapsular cataract   Anterior Vitreous mild syneresis mild syneresis         Fundus Exam       Right Left   Disc Pink and Sharp mild Pallor, Sharp rim   C/D Ratio 0.2 0.2   Macula Flat, blunted foveal reflex, focal MA/DBH with exudates and cystic changes ST to fovea -- persistent with good focal laser changes; boat shaped subhyalod heme IT to fovea -- essentially resolved, barely visible Blunted foveal reflex, circinate exudates and edema temporal macula, scattered MA/DBH - improving -- good targets for focal laser   Vessels mild attenuation, mild tortuosity attenuated, mild tortuousity, scattered, early NV greatest superior to disc--regressing   Periphery Attached, 360 DBH, patches of NVE, good 360 PRP, room for fill in temporally and posteriorly if needed Attached, 360 DBH, patches of NVE regressing, good 360 PRP           Refraction     Wearing Rx       Sphere Cylinder Add   Right +1.75 Sphere +2.50   Left +1.75 Sphere +2.50           IMAGING AND PROCEDURES  Imaging and Procedures for 01/03/2022  OCT, Retina - OU - Both Eyes       Right Eye Quality was good. Central Foveal Thickness: 256. Progression has been stable. Findings include normal foveal contour, no SRF, abnormal foveal contour, intraretinal hyper-reflective material, intraretinal fluid, vitreomacular adhesion (Persistent cystic changes temporal macula ).   Left Eye Quality was good. Central Foveal Thickness: 228. Progression has improved. Findings include no SRF, abnormal foveal contour, intraretinal  hyper-reflective material, intraretinal fluid, vitreomacular adhesion (Mild interval improvement in cystic changes temporal mac).   Notes *Images captured and stored on drive  Diagnosis / Impression:  +DME OU OD: Persistent cystic changes temporal macula  OS: Mild interval improvement  in cystic changes temporal mac  Clinical management:  See below  Abbreviations: NFP - Normal foveal profile. CME - cystoid macular edema. PED - pigment epithelial detachment. IRF - intraretinal fluid. SRF - subretinal fluid. EZ - ellipsoid zone. ERM - epiretinal membrane. ORA - outer retinal atrophy. ORT - outer retinal tubulation. SRHM - subretinal hyper-reflective material. IRHM - intraretinal hyper-reflective material      Intravitreal Injection, Pharmacologic Agent - OS - Left Eye       Time Out 01/03/2022. 4:03 PM. Confirmed correct patient, procedure, site, and patient consented.   Anesthesia Topical anesthesia was used. Anesthetic medications included Lidocaine 2%, Proparacaine 0.5%.   Procedure Preparation included 5% betadine to ocular surface, eyelid speculum. A (32g) needle was used.   Injection: 2 mg aflibercept 2 MG/0.05ML   Route: Intravitreal, Site: Left Eye   NDC: A3590391, Lot: [id number], Expiration date: 04/28/2023, Waste: 0 mL   Post-op Post injection exam found visual acuity of at least counting fingers. The patient tolerated the procedure well. There were no complications. The patient received written and verbal post procedure care education. Post injection medications were not given.            ASSESSMENT/PLAN:   ICD-10-CM   1. Proliferative diabetic retinopathy of both eyes with macular edema associated with type 2 diabetes mellitus (HCC)  E11.3513 OCT, Retina - OU - Both Eyes    Intravitreal Injection, Pharmacologic Agent - OS - Left Eye    aflibercept (EYLEA) SOLN 2 mg    CANCELED: Intravitreal Injection, Pharmacologic Agent - OD - Right Eye    2. Essential  hypertension  I10     3. Hypertensive retinopathy of both eyes  H35.033     4. Combined forms of age-related cataract of both eyes  H25.813       1. Proliferative diabetic retinopathy, both eyes  - FA (07.12.22) shows scattered patches of NVE OU - s/p PRP OD (07.20.22)  - s/p PRP OS (08.03.22)  - s/p IVA OU #1 (08.17.22), #2 (09.14.22), #3 (10.12.22), #4 (11.09.22), #5 (12.21.22), #6 (01.20.23), #7 (02.20.23), #8 (03.29.23), #9 (04.26.23) -- mild IVA resistance OS - s/p IVA OD #10 (05.24.23), #11 (07.10.23), #12 (08.11.23)  - s/p IVE OS #1 (05.24.23), #2 (07.10.23), #3 (08.11.23), #4 (09.08.23)  - s/p focal laser OD (08.21.23)  - history of poor BG control -- now improved on Monjouro  - BCVA OD: 20/25 from 20/20; OS: 20/40 from 20/30  - OCT shows OD: Persistent cystic changes temporal macula; OS: Mild interval improvement in cystic changes temporal mac - recommend IVE OS #5 today, 09.08.23 for DME - will hold off on OD again and follow up in 4 weeks - pt in agreement - RBA of procedure discussed, questions answered - Avastin, informed consent obtained and signed, 08.17.2022 (OU) - Eylea, informed consent obtained and signed, 05.24.23 (OS) - see procedure note - f/u 4 weeks -- DFE/OCT  2,3. Hypertensive retinopathy OU - discussed importance of tight BP control - monitor  4.   Mixed Cataract OU - The symptoms of cataract, surgical options, and treatments and risks were discussed with patient. - discussed diagnosis and progression - monitor  Ophthalmic Meds Ordered this visit:  Meds ordered this encounter  Medications   aflibercept (EYLEA) SOLN 2 mg     Return in about 4 weeks (around 01/31/2022) for f/u PDR OU, DFE, OCT.  There are no Patient Instructions on file for this visit.  This document serves as a record of  services personally performed by Gardiner Sleeper, MD, PhD. It was created on their behalf by Roselee Nova, COMT. The creation of this record is the provider's  dictation and/or activities during the visit.  Electronically signed by: Roselee Nova, COMT 01/03/22 11:30 PM  This document serves as a record of services personally performed by Gardiner Sleeper, MD, PhD. It was created on their behalf by San Jetty. Owens Shark, OA an ophthalmic technician. The creation of this record is the provider's dictation and/or activities during the visit.    Electronically signed by: San Jetty. Owens Shark, [place] 10.09.2023 11:30 PM   Gardiner Sleeper, M.D., Ph.D. Diseases & Surgery of the Retina and Vitreous [facility]  I have reviewed the above documentation for accuracy and completeness, and I agree with the above. Gardiner Sleeper, M.D., Ph.D. 01/03/22 11:36 PM   Abbreviations: M myopia (nearsighted); A astigmatism; H hyperopia (farsighted); P presbyopia; Mrx spectacle prescription;  CTL contact lenses; OD right eye; OS left eye; OU both eyes  XT exotropia; ET esotropia; PEK punctate epithelial keratitis; PEE punctate epithelial erosions; DES dry eye syndrome; MGD meibomian gland dysfunction; ATs artificial tears; PFAT's preservative free artificial tears; [place] nuclear sclerotic cataract; PSC posterior subcapsular cataract; ERM epi-retinal membrane; PVD posterior vitreous detachment; RD retinal detachment; DM diabetes mellitus; DR diabetic retinopathy; NPDR non-proliferative diabetic retinopathy; PDR proliferative diabetic retinopathy; CSME clinically significant macular edema; DME diabetic macular edema; dbh dot blot hemorrhages; CWS cotton wool spot; POAG primary open angle glaucoma; C/D cup-to-disc ratio; HVF humphrey visual field; GVF goldmann visual field; OCT optical coherence tomography; IOP intraocular pressure; BRVO Branch retinal vein occlusion; CRVO central retinal vein occlusion; CRAO central retinal artery occlusion; BRAO branch retinal artery occlusion; RT retinal tear; SB scleral buckle; PPV pars plana vitrectomy; VH Vitreous hemorrhage; PRP  panretinal laser photocoagulation; IVK intravitreal kenalog; VMT vitreomacular traction; MH Macular hole;  NVD neovascularization of the disc; NVE neovascularization elsewhere; AREDS age related eye disease study; ARMD age related macular degeneration; POAG primary open angle glaucoma; EBMD epithelial/anterior basement membrane dystrophy; ACIOL anterior chamber intraocular lens; IOL intraocular lens; PCIOL posterior chamber intraocular lens; Phaco/IOL phacoemulsification with intraocular lens placement; [place] photorefractive keratectomy; LASIK laser assisted in situ keratomileusis; HTN hypertension; DM diabetes mellitus; COPD chronic obstructive pulmonary disease

## 2022-01-03 ENCOUNTER — Encounter (INDEPENDENT_AMBULATORY_CARE_PROVIDER_SITE_OTHER): Payer: Self-pay | Admitting: Ophthalmology

## 2022-01-03 ENCOUNTER — Ambulatory Visit (INDEPENDENT_AMBULATORY_CARE_PROVIDER_SITE_OTHER): Payer: BC Managed Care – PPO | Admitting: Ophthalmology

## 2022-01-03 DIAGNOSIS — I10 Essential (primary) hypertension: Secondary | ICD-10-CM

## 2022-01-03 DIAGNOSIS — H35033 Hypertensive retinopathy, bilateral: Secondary | ICD-10-CM | POA: Diagnosis not present

## 2022-01-03 DIAGNOSIS — H25813 Combined forms of age-related cataract, bilateral: Secondary | ICD-10-CM

## 2022-01-03 DIAGNOSIS — E113513 Type 2 diabetes mellitus with proliferative diabetic retinopathy with macular edema, bilateral: Secondary | ICD-10-CM | POA: Diagnosis not present

## 2022-01-03 MED ORDER — AFLIBERCEPT 2MG/0.05ML IZ SOLN FOR KALEIDOSCOPE
2.0000 mg | INTRAVITREAL | Status: AC | PRN
  Administered 2022-01-03: 2 mg via INTRAVITREAL

## 2022-01-28 NOTE — Progress Notes (Signed)
[facility] Note  01/31/2022    CHIEF COMPLAINT Patient presents for Retina Follow Up  HISTORY OF PRESENT ILLNESS: Jason Bowen is a 61 y.o. male who presents to the clinic today for:    HPI     Retina Follow Up   Patient presents with  Diabetic Retinopathy.  In both eyes.  This started 4 weeks ago.  Duration of 4 weeks.  Since onset it is stable.  I, the attending physician,  performed the HPI with the patient and updated documentation appropriately.        Comments   4 week Retina follow up St[place] pt states no vision changes noticed       Last edited by [name], MD on 01/31/2022  4:02 PM.      Referring physician: No referring provider defined for this encounter.  HISTORICAL INFORMATION:   Selected notes from the MEDICAL RECORD NUMBER Referred by [name] for eval of DR w/DME OS   CURRENT MEDICATIONS: No current outpatient medications on file. (Ophthalmic Drugs)   No current facility-administered medications for this visit. (Ophthalmic Drugs)   Current Outpatient Medications (Other)  Medication Sig   dapagliflozin propanediol (FARXIGA) 10 MG TABS tablet Take 10 mg by mouth daily.   Insulin Degludec (TRESIBA) 100 UNIT/ML SOLN Inject 36 Units into the skin daily.   insulin lispro (HUMALOG) 100 UNIT/ML injection Inject 15 Units into the skin 3 (three) times daily before meals.   lisinopril (ZESTRIL) 20 MG tablet Take 20 mg by mouth daily.   lisinopril (ZESTRIL) 20 MG tablet 1 tablet (Patient not taking: Reported on 12/09/2020)   metFORMIN (GLUCOPHAGE) 500 MG tablet Take 500 mg by mouth 2 (two) times daily with a meal.   metFORMIN (GLUCOPHAGE-XR) 500 MG 24 hr tablet 1 tablet with evening meal (Patient not taking: Reported on 12/09/2020)   tirzepatide (MOUNJARO) 5 MG/0.5ML Pen Inject 5 mg into the skin once a week.   triamcinolone cream (KENALOG) 0.1 % SMARTSIG:1 Application Topical 2-3 Times Daily   No current  facility-administered medications for this visit. (Other)   REVIEW OF SYSTEMS:   ALLERGIES No Known Allergies  PAST MEDICAL HISTORY Past Medical History:  Diagnosis Date   Cancer ([place])    Cataract    Diabetic retinopathy ([place])    Hypertension    Hypertensive retinopathy    Past Surgical History:  Procedure Laterality Date   ACHILLES TENDON SURGERY     ANKLE SURGERY     CARPAL TUNNEL WITH CUBITAL TUNNEL     MOHS SURGERY     has had 20+ surgeries for this on different areas of body   FAMILY HISTORY Family History  Problem Relation Age of Onset   Hypertension Mother    Diabetes Mother    Cataracts Mother    Cancer Paternal Grandmother    SOCIAL HISTORY Social History   Tobacco Use   Smoking status: Never   Smokeless tobacco: Never  Vaping Use   Vaping Use: Never used  Substance Use Topics   Alcohol use: Not Currently   Drug use: Not Currently       OPHTHALMIC EXAM: Base Eye Exam     Visual Acuity (Snellen - Linear)       Right Left   Dist cc 20/25 20/40 -2   Dist ph cc NI NI    Correction: Glasses         Tonometry (Tonopen, 1:18 PM)  Right Left   Pressure 16 16         Pupils       Pupils Dark Light Shape React APD   Right PERRL 3 2 Round Brisk None   Left PERRL 3 2 Round Brisk None         Visual Fields       Left Right    Full Full         Extraocular Movement       Right Left    Full, Ortho Full, Ortho         Neuro/Psych     Oriented x3: Yes   Mood/Affect: Normal         Dilation     Both eyes: 2.5% Phenylephrine @ 1:19 PM           Slit Lamp and Fundus Exam     Slit Lamp Exam       Right Left   Lids/Lashes Dermatochalasis - upper lid Dermatochalasis - upper lid   Conjunctiva/Sclera White and quiet White and quiet   Cornea arcus arcus   Anterior Chamber deep, clear, narrow temporal angle deep, clear, narrow temporal angle   Iris Round and dilated, No NVI Round and dilated, No NVI   Lens  Clear Trace Posterior subcapsular cataract   Anterior Vitreous mild syneresis mild syneresis         Fundus Exam       Right Left   Disc Pink and Sharp mild Pallor, Sharp rim   C/D Ratio 0.2 0.2   Macula Flat, good foveal reflex, focal MA/DBH with exudates and cystic changes ST to fovea -- slightly improved, with good focal laser changes; Blunted foveal reflex, circinate exudates and edema temporal macula, scattered MA/DBH - improving -- good targets for focal laser   Vessels mild attenuation, mild tortuosity attenuated, mild tortuousity, scattered, early NV greatest superior to disc--regressing   Periphery Attached, 360 DBH, patches of NVE, good 360 PRP, room for fill in temporally and posteriorly if needed Attached, 360 DBH, patches of NVE regressing, good 360 PRP           Refraction     Wearing Rx       Sphere Cylinder Add   Right +1.75 Sphere +2.50   Left +1.75 Sphere +2.50           IMAGING AND PROCEDURES  Imaging and Procedures for 01/31/2022  OCT, Retina - OU - Both Eyes       Right Eye Quality was good. Central Foveal Thickness: 257. Progression has improved. Findings include normal foveal contour, no SRF, abnormal foveal contour, intraretinal hyper-reflective material, intraretinal fluid, vitreomacular adhesion (Persistent cystic changes and IRHM temporal macula -- slightly improved).   Left Eye Quality was good. Central Foveal Thickness: 224. Progression has improved. Findings include no SRF, abnormal foveal contour, intraretinal hyper-reflective material, intraretinal fluid, vitreomacular adhesion (Persistent cystic changes and IRHM temporal macula -- slightly improved).   Notes *Images captured and stored on drive  Diagnosis / Impression:  +DME OU OD: Persistent cystic changes and IRHM temporal macula -- slightly improved OS: Persistent cystic changes and IRHM temporal macula -- slightly improved  Clinical management:  See below  Abbreviations: NFP  - Normal foveal profile. CME - cystoid macular edema. PED - pigment epithelial detachment. IRF - intraretinal fluid. SRF - subretinal fluid. EZ - ellipsoid zone. ERM - epiretinal membrane. ORA - outer retinal atrophy. ORT - outer retinal tubulation. SRHM - subretinal hyper-reflective material.  IRHM - intraretinal hyper-reflective material      Intravitreal Injection, Pharmacologic Agent - OS - Left Eye       Time Out 01/31/2022. 1:58 PM. Confirmed correct patient, procedure, site, and patient consented.   Anesthesia Topical anesthesia was used. Anesthetic medications included Lidocaine 2%, Proparacaine 0.5%.   Procedure Preparation included 5% betadine to ocular surface, eyelid speculum. A (32g) needle was used.   Injection: 2 mg aflibercept 2 MG/0.05ML   Route: Intravitreal, Site: Left Eye   NDC: [id number], Lot: [id number], Expiration date: 08/26/2022, Waste: 0 mL   Post-op Post injection exam found visual acuity of at least counting fingers. The patient tolerated the procedure well. There were no complications. The patient received written and verbal post procedure care education. Post injection medications were not given.            ASSESSMENT/PLAN:   ICD-10-CM   1. Proliferative diabetic retinopathy of both eyes with macular edema associated with type 2 diabetes mellitus (HCC)  E11.3513 OCT, Retina - OU - Both Eyes    Intravitreal Injection, Pharmacologic Agent - OS - Left Eye    aflibercept (EYLEA) SOLN 2 mg    2. Essential hypertension  I10     3. Hypertensive retinopathy of both eyes  H35.033     4. Combined forms of age-related cataract of both eyes  H25.813      1. Proliferative diabetic retinopathy, both eyes  - FA (07.12.22) shows scattered patches of NVE OU - s/p PRP OD (07.20.22)  - s/p PRP OS (08.03.22)  - s/p IVA OU #1 (08.17.22), #2 (09.14.22), #3 (10.12.22), #4 (11.09.22), #5 (12.21.22), #6 (01.20.23), #7 (02.20.23), #8 (03.29.23), #9 (04.26.23) --  mild IVA resistance OS - s/p IVA OD #10 (05.24.23), #11 (07.10.23), #12 (08.11.23) - s/p focal laser OD (08.21.23)  - s/p IVE OS #1 (05.24.23), #2 (07.10.23), #3 (08.11.23), #4 (09.08.23), #5 (09.08.23)  - history of poor BG control -- now improved on Monjouro  - BCVA OD: 20/25 (stable); OS: 20/40 (stable) - OCT shows persistent cystic changes and IRHM temporal macula -- slightly improved OU - recommend IVE OS #6 today, 11.06.23 for DME - will hold off on OD again and follow up in 4 weeks - pt in agreement - RBA of procedure discussed, questions answered - Avastin, informed consent obtained and signed, 08.17.2022 (OU) - Eylea, informed consent obtained and signed, 05.24.23 (OS) - see procedure note - f/u 4 weeks -- DFE/OCT, possible injxn - consider focal laser OS in mid-January 2024  2,3. Hypertensive retinopathy OU - discussed importance of tight BP control - monitor  4.   Mixed Cataract OU - The symptoms of cataract, surgical options, and treatments and risks were discussed with patient. - discussed diagnosis and progression - monitor  Ophthalmic Meds Ordered this visit:  Meds ordered this encounter  Medications   aflibercept (EYLEA) SOLN 2 mg     Return in about 4 weeks (around 02/28/2022) for f/u PDR OU, DFE, OCT.  There are no Patient Instructions on file for this visit.  This document serves as a record of services personally performed by Gardiner Sleeper, MD, PhD. It was created on their behalf by Roselee Nova, COMT. The creation of this record is the provider's dictation and/or activities during the visit.  Electronically signed by: Roselee Nova, COMT 01/31/22 4:08 PM  This document serves as a record of services personally performed by Gardiner Sleeper, MD, PhD. It was created on their behalf by San Jetty.  Owens Shark, OA an ophthalmic technician. The creation of this record is the provider's dictation and/or activities during the visit.    Electronically signed by: San Jetty. Owens Shark, [place] 11.06.2023 4:08 PM   Gardiner Sleeper, M.D., Ph.D. Diseases & Surgery of the Retina and Vitreous [facility]  I have reviewed the above documentation for accuracy and completeness, and I agree with the above. Gardiner Sleeper, M.D., Ph.D. 01/31/22 4:08 PM  Abbreviations: M myopia (nearsighted); A astigmatism; H hyperopia (farsighted); P presbyopia; Mrx spectacle prescription;  CTL contact lenses; OD right eye; OS left eye; OU both eyes  XT exotropia; ET esotropia; PEK punctate epithelial keratitis; PEE punctate epithelial erosions; DES dry eye syndrome; MGD meibomian gland dysfunction; ATs artificial tears; PFAT's preservative free artificial tears; [place] nuclear sclerotic cataract; PSC posterior subcapsular cataract; ERM epi-retinal membrane; PVD posterior vitreous detachment; RD retinal detachment; DM diabetes mellitus; DR diabetic retinopathy; NPDR non-proliferative diabetic retinopathy; PDR proliferative diabetic retinopathy; CSME clinically significant macular edema; DME diabetic macular edema; dbh dot blot hemorrhages; CWS cotton wool spot; POAG primary open angle glaucoma; C/D cup-to-disc ratio; HVF humphrey visual field; GVF goldmann visual field; OCT optical coherence tomography; IOP intraocular pressure; BRVO Branch retinal vein occlusion; CRVO central retinal vein occlusion; CRAO central retinal artery occlusion; BRAO branch retinal artery occlusion; RT retinal tear; SB scleral buckle; PPV pars plana vitrectomy; VH Vitreous hemorrhage; PRP panretinal laser photocoagulation; IVK intravitreal kenalog; VMT vitreomacular traction; MH Macular hole;  NVD neovascularization of the disc; NVE neovascularization elsewhere; AREDS age related eye disease study; ARMD age related macular degeneration; POAG primary open angle glaucoma; EBMD epithelial/anterior basement membrane dystrophy; ACIOL anterior chamber intraocular lens; IOL intraocular lens; PCIOL posterior chamber  intraocular lens; Phaco/IOL phacoemulsification with intraocular lens placement; [place] photorefractive keratectomy; LASIK laser assisted in situ keratomileusis; HTN hypertension; DM diabetes mellitus; COPD chronic obstructive pulmonary disease

## 2022-01-31 ENCOUNTER — Ambulatory Visit (INDEPENDENT_AMBULATORY_CARE_PROVIDER_SITE_OTHER): Payer: BC Managed Care – PPO | Admitting: Ophthalmology

## 2022-01-31 ENCOUNTER — Encounter (INDEPENDENT_AMBULATORY_CARE_PROVIDER_SITE_OTHER): Payer: Self-pay | Admitting: Ophthalmology

## 2022-01-31 DIAGNOSIS — H35033 Hypertensive retinopathy, bilateral: Secondary | ICD-10-CM | POA: Diagnosis not present

## 2022-01-31 DIAGNOSIS — E113513 Type 2 diabetes mellitus with proliferative diabetic retinopathy with macular edema, bilateral: Secondary | ICD-10-CM | POA: Diagnosis not present

## 2022-01-31 DIAGNOSIS — I10 Essential (primary) hypertension: Secondary | ICD-10-CM

## 2022-01-31 DIAGNOSIS — H25813 Combined forms of age-related cataract, bilateral: Secondary | ICD-10-CM

## 2022-01-31 MED ORDER — AFLIBERCEPT 2MG/0.05ML IZ SOLN FOR KALEIDOSCOPE
2.0000 mg | INTRAVITREAL | Status: AC | PRN
  Administered 2022-01-31: 2 mg via INTRAVITREAL

## 2022-02-14 DIAGNOSIS — L570 Actinic keratosis: Secondary | ICD-10-CM | POA: Diagnosis not present

## 2022-02-14 DIAGNOSIS — C44712 Basal cell carcinoma of skin of right lower limb, including hip: Secondary | ICD-10-CM | POA: Diagnosis not present

## 2022-02-25 NOTE — Progress Notes (Signed)
[facility] Note  02/28/2022    CHIEF COMPLAINT Patient presents for Retina Follow Up  HISTORY OF PRESENT ILLNESS: Jason Bowen is a 61 y.o. male who presents to the clinic today for:    HPI     Retina Follow Up   Patient presents with  Diabetic Retinopathy.  In both eyes.  This started 4 weeks ago.  Duration of 4 weeks.  Since onset it is stable.  I, the attending physician,  performed the HPI with the patient and updated documentation appropriately.        Comments   4 week retina eval PDR OU and IVE OS pt states no vision changes noticed pt last blood sugar was 98 this morning       Last edited by [name], MD on 03/02/2022 12:28 AM.    Pt states vision "seems good"   Referring physician: No referring provider defined for this encounter.  HISTORICAL INFORMATION:   Selected notes from the MEDICAL RECORD NUMBER Referred by [name] for eval of DR w/DME OS   CURRENT MEDICATIONS: No current outpatient medications on file. (Ophthalmic Drugs)   No current facility-administered medications for this visit. (Ophthalmic Drugs)   Current Outpatient Medications (Other)  Medication Sig   dapagliflozin propanediol (FARXIGA) 10 MG TABS tablet Take 10 mg by mouth daily.   Insulin Degludec (TRESIBA) 100 UNIT/ML SOLN Inject 36 Units into the skin daily.   insulin lispro (HUMALOG) 100 UNIT/ML injection Inject 15 Units into the skin 3 (three) times daily before meals.   lisinopril (ZESTRIL) 20 MG tablet Take 20 mg by mouth daily.   lisinopril (ZESTRIL) 20 MG tablet 1 tablet (Patient not taking: Reported on 12/09/2020)   metFORMIN (GLUCOPHAGE) 500 MG tablet Take 500 mg by mouth 2 (two) times daily with a meal.   metFORMIN (GLUCOPHAGE-XR) 500 MG 24 hr tablet 1 tablet with evening meal (Patient not taking: Reported on 12/09/2020)   tirzepatide (MOUNJARO) 5 MG/0.5ML Pen Inject 5 mg into the skin once a week.   triamcinolone cream (KENALOG) 0.1 %  SMARTSIG:1 Application Topical 2-3 Times Daily   No current facility-administered medications for this visit. (Other)   REVIEW OF SYSTEMS:   ALLERGIES No Known Allergies  PAST MEDICAL HISTORY Past Medical History:  Diagnosis Date   Cancer ([place])    Cataract    Diabetic retinopathy ([place])    Hypertension    Hypertensive retinopathy    Past Surgical History:  Procedure Laterality Date   ACHILLES TENDON SURGERY     ANKLE SURGERY     CARPAL TUNNEL WITH CUBITAL TUNNEL     MOHS SURGERY     has had 20+ surgeries for this on different areas of body   FAMILY HISTORY Family History  Problem Relation Age of Onset   Hypertension Mother    Diabetes Mother    Cataracts Mother    Cancer Paternal Grandmother    SOCIAL HISTORY Social History   Tobacco Use   Smoking status: Never   Smokeless tobacco: Never  Vaping Use   Vaping Use: Never used  Substance Use Topics   Alcohol use: Not Currently   Drug use: Not Currently       OPHTHALMIC EXAM: Base Eye Exam     Visual Acuity (Snellen - Linear)       Right Left   Dist [place] 20/25 +2 20/40 -1   Dist ph cc NI NI    Correction: Glasses  Tonometry (Tonopen, 1:16 PM)       Right Left   Pressure 16 14         Pupils       Pupils Dark Light Shape React APD   Right PERRL 3 2 Round Brisk None   Left PERRL 3 2 Round Brisk None         Visual Fields       Left Right    Full Full         Extraocular Movement       Right Left    Full, Ortho Full, Ortho         Neuro/Psych     Oriented x3: Yes   Mood/Affect: Normal         Dilation     Both eyes: 2.5% Phenylephrine @ 1:15 PM           Slit Lamp and Fundus Exam     Slit Lamp Exam       Right Left   Lids/Lashes Dermatochalasis - upper lid Dermatochalasis - upper lid   Conjunctiva/Sclera White and quiet White and quiet   Cornea arcus arcus   Anterior Chamber deep, clear, narrow temporal angle deep, clear, narrow temporal angle    Iris Round and dilated, No NVI Round and dilated, No NVI   Lens Clear Trace Posterior subcapsular cataract   Anterior Vitreous mild syneresis mild syneresis         Fundus Exam       Right Left   Disc Pink and Sharp mild Pallor, Sharp rim, mild PPA   C/D Ratio 0.2 0.2   Macula Flat, good foveal reflex, focal MA/DBH with exudates and cystic changes ST to fovea -- slightly improved, with good focal laser changes, single prominent IRH ST mac Blunted foveal reflex, circinate exudates and edema temporal macula, scattered MA/DBH - improving -- good targets for focal laser   Vessels mild attenuation, mild tortuosity attenuated, mild tortuousity, scattered, early NV greatest superior to disc--regressing   Periphery Attached, 360 DBH, patches of NVE, good 360 PRP, room for fill in temporally and posteriorly if needed Attached, 360 DBH, patches of NVE regressing, good 360 PRP           Refraction     Wearing Rx       Sphere Cylinder Add   Right +1.75 Sphere +2.50   Left +1.75 Sphere +2.50           IMAGING AND PROCEDURES  Imaging and Procedures for 02/28/2022  OCT, Retina - OU - Both Eyes       Right Eye Quality was good. Central Foveal Thickness: 260. Progression has improved. Findings include normal foveal contour, no SRF, abnormal foveal contour, intraretinal hyper-reflective material, intraretinal fluid, vitreomacular adhesion (Persistent cystic changes and IRHM temporal macula -- slightly improved).   Left Eye Quality was good. Central Foveal Thickness: 227. Progression has improved. Findings include no SRF, abnormal foveal contour, intraretinal hyper-reflective material, intraretinal fluid, vitreomacular adhesion (Persistent cystic changes and IRHM temporal macula -- slightly improved).   Notes *Images captured and stored on drive  Diagnosis / Impression:  +DME OU OD: Persistent cystic changes and IRHM temporal macula -- slightly improved OS: Persistent cystic changes  and IRHM temporal macula -- slightly improved  Clinical management:  See below  Abbreviations: NFP - Normal foveal profile. CME - cystoid macular edema. PED - pigment epithelial detachment. IRF - intraretinal fluid. SRF - subretinal fluid. EZ - ellipsoid zone. ERM -  epiretinal membrane. ORA - outer retinal atrophy. ORT - outer retinal tubulation. SRHM - subretinal hyper-reflective material. IRHM - intraretinal hyper-reflective material      Intravitreal Injection, Pharmacologic Agent - OS - Left Eye       Time Out 02/28/2022. 1:48 PM. Confirmed correct patient, procedure, site, and patient consented.   Anesthesia Topical anesthesia was used. Anesthetic medications included Lidocaine 2%, Proparacaine 0.5%.   Procedure Preparation included 5% betadine to ocular surface, eyelid speculum. A (32g) needle was used.   Injection: 2 mg aflibercept 2 MG/0.05ML   Route: Intravitreal, Site: Left Eye   NDC: [id number], Lot: [id number], Expiration date: 05/26/2023, Waste: 0 mL   Post-op Post injection exam found visual acuity of at least counting fingers. The patient tolerated the procedure well. There were no complications. The patient received written and verbal post procedure care education. Post injection medications were not given.            ASSESSMENT/PLAN:   ICD-10-CM   1. Proliferative diabetic retinopathy of both eyes with macular edema associated with type 2 diabetes mellitus (HCC)  E11.3513 OCT, Retina - OU - Both Eyes    Intravitreal Injection, Pharmacologic Agent - OS - Left Eye    aflibercept (EYLEA) SOLN 2 mg    2. Essential hypertension  I10     3. Hypertensive retinopathy of both eyes  H35.033     4. Combined forms of age-related cataract of both eyes  H25.813      1. Proliferative diabetic retinopathy, both eyes  - FA (07.12.22) shows scattered patches of NVE OU - s/p PRP OD (07.20.22)  - s/p PRP OS (08.03.22)  - s/p IVA OU #1 (08.17.22), #2 (09.14.22), #3  (10.12.22), #4 (11.09.22), #5 (12.21.22), #6 (01.20.23), #7 (02.20.23), #8 (03.29.23), #9 (04.26.23) -- mild IVA resistance OS - s/p IVA OD #10 (05.24.23), #11 (07.10.23), #12 (08.11.23) - s/p focal laser OD (08.21.23)  - s/p IVE OS #1 (05.24.23), #2 (07.10.23), #3 (08.11.23), #4 (09.08.23), #5 (09.08.23), #6 (11.06.23)  - history of poor BG control -- now improved on Monjouro  - BCVA OD: 20/25 (stable); OS: 20/40 (stable) - OCT shows persistent cystic changes and IRHM temporal macula -- slightly improved OU - recommend IVE OS #7 today, 12.04.23 for DME - will hold off on OD again and follow up in 4 weeks - pt in agreement - RBA of procedure discussed, questions answered - Avastin, informed consent obtained and signed, 08.17.2022 (OU) - Eylea, informed consent obtained and signed, 05.24.23 (OS) - see procedure note - f/u 4 weeks -- DFE/OCT, possible injxn - consider focal laser OS in mid-January 2024  2,3. Hypertensive retinopathy OU - discussed importance of tight BP control - monitor  4.   Mixed Cataract OU - The symptoms of cataract, surgical options, and treatments and risks were discussed with patient. - discussed diagnosis and progression - monitor  Ophthalmic Meds Ordered this visit:  Meds ordered this encounter  Medications   aflibercept (EYLEA) SOLN 2 mg     Return for f/u 4-5 weeks, PDR OU, DFE, OCT.  There are no Patient Instructions on file for this visit.  This document serves as a record of services personally performed by Gardiner Sleeper, MD, PhD. It was created on their behalf by San Jetty. Owens Shark, OA an ophthalmic technician. The creation of this record is the provider's dictation and/or activities during the visit.    Electronically signed by: San Jetty. Owens Shark, [place] 12.01.2023 12:28 AM  Gardiner Sleeper,  M.D., Ph.D. Diseases & Surgery of the Retina and [place]  I have reviewed the above documentation for accuracy and  completeness, and I agree with the above. Gardiner Sleeper, M.D., Ph.D. 03/02/22 12:35 AM  Abbreviations: M myopia (nearsighted); A astigmatism; H hyperopia (farsighted); P presbyopia; Mrx spectacle prescription;  CTL contact lenses; OD right eye; OS left eye; OU both eyes  XT exotropia; ET esotropia; PEK punctate epithelial keratitis; PEE punctate epithelial erosions; DES dry eye syndrome; MGD meibomian gland dysfunction; ATs artificial tears; PFAT's preservative free artificial tears; [place] nuclear sclerotic cataract; PSC posterior subcapsular cataract; ERM epi-retinal membrane; PVD posterior vitreous detachment; RD retinal detachment; DM diabetes mellitus; DR diabetic retinopathy; NPDR non-proliferative diabetic retinopathy; PDR proliferative diabetic retinopathy; CSME clinically significant macular edema; DME diabetic macular edema; dbh dot blot hemorrhages; CWS cotton wool spot; POAG primary open angle glaucoma; C/D cup-to-disc ratio; HVF humphrey visual field; GVF goldmann visual field; OCT optical coherence tomography; IOP intraocular pressure; BRVO Branch retinal vein occlusion; CRVO central retinal vein occlusion; CRAO central retinal artery occlusion; BRAO branch retinal artery occlusion; RT retinal tear; SB scleral buckle; PPV pars plana vitrectomy; VH Vitreous hemorrhage; PRP panretinal laser photocoagulation; IVK intravitreal kenalog; VMT vitreomacular traction; MH Macular hole;  NVD neovascularization of the disc; NVE neovascularization elsewhere; AREDS age related eye disease study; ARMD age related macular degeneration; POAG primary open angle glaucoma; EBMD epithelial/anterior basement membrane dystrophy; ACIOL anterior chamber intraocular lens; IOL intraocular lens; PCIOL posterior chamber intraocular lens; Phaco/IOL phacoemulsification with intraocular lens placement; [place] photorefractive keratectomy; LASIK laser assisted in situ keratomileusis; HTN hypertension; DM diabetes mellitus; COPD chronic  obstructive pulmonary disease

## 2022-02-28 ENCOUNTER — Ambulatory Visit (INDEPENDENT_AMBULATORY_CARE_PROVIDER_SITE_OTHER): Payer: BC Managed Care – PPO | Admitting: Ophthalmology

## 2022-02-28 ENCOUNTER — Encounter (INDEPENDENT_AMBULATORY_CARE_PROVIDER_SITE_OTHER): Payer: Self-pay | Admitting: Ophthalmology

## 2022-02-28 DIAGNOSIS — I10 Essential (primary) hypertension: Secondary | ICD-10-CM

## 2022-02-28 DIAGNOSIS — E113513 Type 2 diabetes mellitus with proliferative diabetic retinopathy with macular edema, bilateral: Secondary | ICD-10-CM | POA: Diagnosis not present

## 2022-02-28 DIAGNOSIS — H35033 Hypertensive retinopathy, bilateral: Secondary | ICD-10-CM

## 2022-02-28 DIAGNOSIS — H25813 Combined forms of age-related cataract, bilateral: Secondary | ICD-10-CM | POA: Diagnosis not present

## 2022-02-28 MED ORDER — AFLIBERCEPT 2MG/0.05ML IZ SOLN FOR KALEIDOSCOPE
2.0000 mg | INTRAVITREAL | Status: AC | PRN
  Administered 2022-02-28: 2 mg via INTRAVITREAL

## 2022-03-01 ENCOUNTER — Other Ambulatory Visit (HOSPITAL_BASED_OUTPATIENT_CLINIC_OR_DEPARTMENT_OTHER): Payer: Self-pay | Admitting: Endocrinology

## 2022-03-01 DIAGNOSIS — E1165 Type 2 diabetes mellitus with hyperglycemia: Secondary | ICD-10-CM

## 2022-03-01 DIAGNOSIS — Z794 Long term (current) use of insulin: Secondary | ICD-10-CM | POA: Diagnosis not present

## 2022-03-01 DIAGNOSIS — E1141 Type 2 diabetes mellitus with diabetic mononeuropathy: Secondary | ICD-10-CM | POA: Diagnosis not present

## 2022-03-01 DIAGNOSIS — Z6833 Body mass index (BMI) 33.0-33.9, adult: Secondary | ICD-10-CM | POA: Diagnosis not present

## 2022-03-02 ENCOUNTER — Telehealth (HOSPITAL_BASED_OUTPATIENT_CLINIC_OR_DEPARTMENT_OTHER): Payer: Self-pay

## 2022-03-30 NOTE — Progress Notes (Signed)
[facility] Note  04/04/2022    CHIEF COMPLAINT Patient presents for Retina Follow Up  HISTORY OF PRESENT ILLNESS: Jason Bowen is a 62 y.o. male who presents to the clinic today for:    HPI     Retina Follow Up   Patient presents with  Diabetic Retinopathy.  In both eyes.  This started 4 weeks ago.  Duration of 4 weeks.  I, the attending physician,  performed the HPI with the patient and updated documentation appropriately.        Comments   4 week retina follow up pt states no vision changes noticed pt denies any flashes or floaters       Last edited by [name], MD on 04/04/2022  1:58 PM.     Referring physician: No referring provider defined for this encounter.  HISTORICAL INFORMATION:   Selected notes from the MEDICAL RECORD NUMBER Referred by [name] for eval of DR w/DME OS   CURRENT MEDICATIONS: No current outpatient medications on file. (Ophthalmic Drugs)   No current facility-administered medications for this visit. (Ophthalmic Drugs)   Current Outpatient Medications (Other)  Medication Sig   dapagliflozin propanediol (FARXIGA) 10 MG TABS tablet Take 10 mg by mouth daily.   Insulin Degludec (TRESIBA) 100 UNIT/ML SOLN Inject 36 Units into the skin daily.   insulin lispro (HUMALOG) 100 UNIT/ML injection Inject 15 Units into the skin 3 (three) times daily before meals.   lisinopril (ZESTRIL) 20 MG tablet Take 20 mg by mouth daily.   lisinopril (ZESTRIL) 20 MG tablet 1 tablet (Patient not taking: Reported on 12/09/2020)   metFORMIN (GLUCOPHAGE) 500 MG tablet Take 500 mg by mouth 2 (two) times daily with a meal.   metFORMIN (GLUCOPHAGE-XR) 500 MG 24 hr tablet 1 tablet with evening meal (Patient not taking: Reported on 12/09/2020)   tirzepatide (MOUNJARO) 5 MG/0.5ML Pen Inject 5 mg into the skin once a week.   triamcinolone cream (KENALOG) 0.1 % SMARTSIG:1 Application Topical 2-3 Times Daily   No current facility-administered  medications for this visit. (Other)   REVIEW OF SYSTEMS: ROS   Positive for: Endocrine, Eyes Last edited by [name], MD on 04/04/2022  1:58 PM.     ALLERGIES No Known Allergies  PAST MEDICAL HISTORY Past Medical History:  Diagnosis Date   Cancer ([place])    Cataract    Diabetic retinopathy ([place])    Hypertension    Hypertensive retinopathy    Past Surgical History:  Procedure Laterality Date   ACHILLES TENDON SURGERY     ANKLE SURGERY     CARPAL TUNNEL WITH CUBITAL TUNNEL     MOHS SURGERY     has had 20+ surgeries for this on different areas of body   FAMILY HISTORY Family History  Problem Relation Age of Onset   Hypertension Mother    Diabetes Mother    Cataracts Mother    Cancer Paternal Grandmother    SOCIAL HISTORY Social History   Tobacco Use   Smoking status: Never   Smokeless tobacco: Never  Vaping Use   Vaping Use: Never used  Substance Use Topics   Alcohol use: Not Currently   Drug use: Not Currently       OPHTHALMIC EXAM: Base Eye Exam     Visual Acuity (Snellen - Linear)       Right Left   Dist cc 20/25 +2 20/30 -2    Correction: Glasses  Tonometry (Tonopen, 1:21 PM)       Right Left   Pressure 14 12         Pupils       Pupils Dark Light Shape React APD   Right PERRL 3 2 Round Brisk None   Left PERRL 3 2 Round Brisk None         Visual Fields       Left Right    Full Full         Extraocular Movement       Right Left    Full, Ortho Full, Ortho         Neuro/Psych     Oriented x3: Yes   Mood/Affect: Normal         Dilation     Both eyes: 2.5% Phenylephrine @ 1:21 PM           Slit Lamp and Fundus Exam     Slit Lamp Exam       Right Left   Lids/Lashes Dermatochalasis - upper lid Dermatochalasis - upper lid   Conjunctiva/Sclera White and quiet White and quiet   Cornea arcus arcus   Anterior Chamber deep, clear, narrow temporal angle deep, clear, narrow temporal angle   Iris Round  and dilated, No NVI Round and dilated, No NVI   Lens Clear Trace Posterior subcapsular cataract   Anterior Vitreous mild syneresis mild syneresis         Fundus Exam       Right Left   Disc Pink and Sharp mild Pallor, Sharp rim, mild PPA   C/D Ratio 0.2 0.2   Macula Flat, good foveal reflex, focal MA/DBH with exudates and cystic changes ST to fovea -- slightly improved, good focal laser changes, single prominent IRH ST mac Blunted foveal reflex, circinate exudates and edema temporal macula -- improved, scattered MA/DBH - improving -- good targets for focal laser   Vessels mild attenuation, mild tortuosity attenuated, mild tortuousity, scattered, early NV greatest superior to disc--regressing   Periphery Attached, 360 DBH, patches of NVE, good 360 PRP, room for fill in temporally and posteriorly if needed Attached, 360 DBH, patches of NVE regressing, good 360 PRP           Refraction     Wearing Rx       Sphere Cylinder Add   Right +1.75 Sphere +2.50   Left +1.75 Sphere +2.50           IMAGING AND PROCEDURES  Imaging and Procedures for 04/04/2022  OCT, Retina - OU - Both Eyes       Right Eye Quality was good. Central Foveal Thickness: 259. Progression has improved. Findings include normal foveal contour, no SRF, abnormal foveal contour, intraretinal hyper-reflective material, intraretinal fluid, vitreomacular adhesion (Persistent cystic changes and IRHM temporal macula -- slightly improved).   Left Eye Quality was good. Central Foveal Thickness: 223. Progression has improved. Findings include no SRF, abnormal foveal contour, intraretinal hyper-reflective material, intraretinal fluid, vitreomacular adhesion (Mild interval improvement in IRF and IRHM temporal macula).   Notes *Images captured and stored on drive  Diagnosis / Impression:  +DME OU OD: Persistent cystic changes and IRHM temporal macula -- slightly improved OS: Mild interval improvement in IRF and IRHM  temporal macula  Clinical management:  See below  Abbreviations: NFP - Normal foveal profile. CME - cystoid macular edema. PED - pigment epithelial detachment. IRF - intraretinal fluid. SRF - subretinal fluid. EZ - ellipsoid zone. ERM - epiretinal  membrane. ORA - outer retinal atrophy. ORT - outer retinal tubulation. SRHM - subretinal hyper-reflective material. IRHM - intraretinal hyper-reflective material      Intravitreal Injection, Pharmacologic Agent - OS - Left Eye       Time Out 04/04/2022. 1:48 PM. Confirmed correct patient, procedure, site, and patient consented.   Anesthesia Topical anesthesia was used. Anesthetic medications included Lidocaine 2%, Proparacaine 0.5%.   Procedure Preparation included 5% betadine to ocular surface, eyelid speculum. A (32g) needle was used.   Injection: 2 mg aflibercept 2 MG/0.05ML   Route: Intravitreal, Site: Left Eye   NDC: [id number], Lot: [id number], Expiration date: 06/25/2023, Waste: 0 mL   Post-op Post injection exam found visual acuity of at least counting fingers. The patient tolerated the procedure well. There were no complications. The patient received written and verbal post procedure care education. Post injection medications were not given.            ASSESSMENT/PLAN:   ICD-10-CM   1. Proliferative diabetic retinopathy of both eyes with macular edema associated with type 2 diabetes mellitus (HCC)  E11.3513 OCT, Retina - OU - Both Eyes    Intravitreal Injection, Pharmacologic Agent - OS - Left Eye    aflibercept (EYLEA) SOLN 2 mg    2. Essential hypertension  I10     3. Hypertensive retinopathy of both eyes  H35.033     4. Combined forms of age-related cataract of both eyes  H25.813      1. Proliferative diabetic retinopathy, both eyes  - FA (07.12.22) shows scattered patches of NVE OU - s/p PRP OD (07.20.22)  - s/p PRP OS (08.03.22)  - s/p IVA OU #1 (08.17.22), #2 (09.14.22), #3 (10.12.22), #4 (11.09.22), #5  (12.21.22), #6 (01.20.23), #7 (02.20.23), #8 (03.29.23), #9 (04.26.23) -- mild IVA resistance OS - s/p IVA OD #10 (05.24.23), #11 (07.10.23), #12 (08.11.23) - s/p focal laser OD (08.21.23)  - s/p IVE OS #1 (05.24.23), #2 (07.10.23), #3 (08.11.23), #4 (09.08.23), #5 (09.08.23), #6 (11.06.23), #7 (12.04.23)  - history of poor BG control -- now improved on Monjouro  - BCVA OD: 20/25 (stable); OS: 20/40 (stable) - OCT shows OD: Persistent cystic changes and IRHM temporal macula -- slightly improved; OS: Mild interval improvement in IRF and IRHM temporal macula - recommend IVE OS #8 today, 01.08.24 for DME - will hold off on OD again - pt in agreement - RBA of procedure discussed, questions answered - Avastin, informed consent obtained and signed, 08.17.2022 (OU) - Eylea, informed consent obtained and signed, 05.24.23 (OS) - see procedure note  - f/u January 29 -- DFE/OCT, possible focal laser OS  2,3. Hypertensive retinopathy OU - discussed importance of tight BP control - monitor  4.   Mixed Cataract OU - The symptoms of cataract, surgical options, and treatments and risks were discussed with patient. - discussed diagnosis and progression - monitor  Ophthalmic Meds Ordered this visit:  Meds ordered this encounter  Medications   aflibercept (EYLEA) SOLN 2 mg     Return in about 3 weeks (around 04/25/2022) for f/u PDR OU, DFE, OCT; focal laser OS.  There are no Patient Instructions on file for this visit.  This document serves as a record of services personally performed by Gardiner Sleeper, MD, PhD. It was created on their behalf by Orvan Falconer, an ophthalmic technician. The creation of this record is the provider's dictation and/or activities during the visit.    Electronically signed by: Orvan Falconer, OA, 04/04/22  4:03  PM  This document serves as a record of services personally performed by Gardiner Sleeper, MD, PhD. It was created on their behalf by San Jetty. Owens Shark, OA an  ophthalmic technician. The creation of this record is the provider's dictation and/or activities during the visit.    Electronically signed by: San Jetty. Owens Shark, [place] 01.08.2024 4:03 PM  Gardiner Sleeper, M.D., Ph.D. Diseases & Surgery of the Retina and Vitreous [facility]  I have reviewed the above documentation for accuracy and completeness, and I agree with the above. Gardiner Sleeper, M.D., Ph.D. 04/04/22 4:05 PM   Abbreviations: M myopia (nearsighted); A astigmatism; H hyperopia (farsighted); P presbyopia; Mrx spectacle prescription;  CTL contact lenses; OD right eye; OS left eye; OU both eyes  XT exotropia; ET esotropia; PEK punctate epithelial keratitis; PEE punctate epithelial erosions; DES dry eye syndrome; MGD meibomian gland dysfunction; ATs artificial tears; PFAT's preservative free artificial tears; [place] nuclear sclerotic cataract; PSC posterior subcapsular cataract; ERM epi-retinal membrane; PVD posterior vitreous detachment; RD retinal detachment; DM diabetes mellitus; DR diabetic retinopathy; NPDR non-proliferative diabetic retinopathy; PDR proliferative diabetic retinopathy; CSME clinically significant macular edema; DME diabetic macular edema; dbh dot blot hemorrhages; CWS cotton wool spot; POAG primary open angle glaucoma; C/D cup-to-disc ratio; HVF humphrey visual field; GVF goldmann visual field; OCT optical coherence tomography; IOP intraocular pressure; BRVO Branch retinal vein occlusion; CRVO central retinal vein occlusion; CRAO central retinal artery occlusion; BRAO branch retinal artery occlusion; RT retinal tear; SB scleral buckle; PPV pars plana vitrectomy; VH Vitreous hemorrhage; PRP panretinal laser photocoagulation; IVK intravitreal kenalog; VMT vitreomacular traction; MH Macular hole;  NVD neovascularization of the disc; NVE neovascularization elsewhere; AREDS age related eye disease study; ARMD age related macular degeneration; POAG primary open angle  glaucoma; EBMD epithelial/anterior basement membrane dystrophy; ACIOL anterior chamber intraocular lens; IOL intraocular lens; PCIOL posterior chamber intraocular lens; Phaco/IOL phacoemulsification with intraocular lens placement; [place] photorefractive keratectomy; LASIK laser assisted in situ keratomileusis; HTN hypertension; DM diabetes mellitus; COPD chronic obstructive pulmonary disease

## 2022-04-04 ENCOUNTER — Ambulatory Visit (INDEPENDENT_AMBULATORY_CARE_PROVIDER_SITE_OTHER): Payer: BC Managed Care – PPO | Admitting: Ophthalmology

## 2022-04-04 ENCOUNTER — Encounter (INDEPENDENT_AMBULATORY_CARE_PROVIDER_SITE_OTHER): Payer: Self-pay | Admitting: Ophthalmology

## 2022-04-04 DIAGNOSIS — E113513 Type 2 diabetes mellitus with proliferative diabetic retinopathy with macular edema, bilateral: Secondary | ICD-10-CM

## 2022-04-04 DIAGNOSIS — I10 Essential (primary) hypertension: Secondary | ICD-10-CM | POA: Diagnosis not present

## 2022-04-04 DIAGNOSIS — H25813 Combined forms of age-related cataract, bilateral: Secondary | ICD-10-CM

## 2022-04-04 DIAGNOSIS — H35033 Hypertensive retinopathy, bilateral: Secondary | ICD-10-CM | POA: Diagnosis not present

## 2022-04-04 MED ORDER — AFLIBERCEPT 2MG/0.05ML IZ SOLN FOR KALEIDOSCOPE
2.0000 mg | INTRAVITREAL | Status: AC | PRN
  Administered 2022-04-04: 2 mg via INTRAVITREAL

## 2022-04-12 ENCOUNTER — Telehealth (HOSPITAL_BASED_OUTPATIENT_CLINIC_OR_DEPARTMENT_OTHER): Payer: Self-pay

## 2022-04-19 NOTE — Progress Notes (Signed)
[facility] Note  04/25/2022    CHIEF COMPLAINT Patient presents for Retina Follow Up  HISTORY OF PRESENT ILLNESS: Jason Bowen is a 62 y.o. male who presents to the clinic today for:    HPI     Retina Follow Up   Patient presents with  Diabetic Retinopathy.  In both eyes.  This started 3 weeks ago.  I, the attending physician,  performed the HPI with the patient and updated documentation appropriately.        Comments   Patient here for 3 weeks retina follow up for PDR OU. Patient states vision doing good. No eye pain. Not using drops.       Last edited by [name], MD on 04/25/2022  4:53 PM.      Referring physician: No referring provider defined for this encounter.  HISTORICAL INFORMATION:   Selected notes from the MEDICAL RECORD NUMBER Referred by [name] for eval of DR w/DME OS   CURRENT MEDICATIONS: Current Outpatient Medications (Ophthalmic Drugs)  Medication Sig   prednisoLONE acetate (PRED FORTE) 1 % ophthalmic suspension Place 1 drop into the left eye 4 (four) times daily for 7 days.   No current facility-administered medications for this visit. (Ophthalmic Drugs)   Current Outpatient Medications (Other)  Medication Sig   dapagliflozin propanediol (FARXIGA) 10 MG TABS tablet Take 10 mg by mouth daily.   Insulin Degludec (TRESIBA) 100 UNIT/ML SOLN Inject 20 Units into the skin daily.   insulin lispro (HUMALOG) 100 UNIT/ML injection Inject 5 Units into the skin 3 (three) times daily before meals.   lisinopril (ZESTRIL) 20 MG tablet Take 20 mg by mouth daily.   tirzepatide [facility]) 5 MG/0.5ML Pen Inject 5 mg into the skin once a week.   lisinopril (ZESTRIL) 20 MG tablet 1 tablet (Patient not taking: Reported on 12/09/2020)   metFORMIN (GLUCOPHAGE) 500 MG tablet Take 500 mg by mouth 2 (two) times daily with a meal. (Patient not taking: Reported on 04/25/2022)   metFORMIN (GLUCOPHAGE-XR) 500 MG 24 hr tablet 1 tablet with  evening meal (Patient not taking: Reported on 12/09/2020)   triamcinolone cream (KENALOG) 0.1 % SMARTSIG:1 Application Topical 2-3 Times Daily (Patient not taking: Reported on 04/25/2022)   No current facility-administered medications for this visit. (Other)   REVIEW OF SYSTEMS: ROS   Positive for: Endocrine, Eyes Negative for: Constitutional, Gastrointestinal, Neurological, Skin, Genitourinary, Musculoskeletal, HENT, Cardiovascular, Respiratory, Psychiatric, Allergic/Imm, Heme/Lymph Last edited by Theodore Demark, COA on 04/25/2022  2:54 PM.      ALLERGIES No Known Allergies  PAST MEDICAL HISTORY Past Medical History:  Diagnosis Date   Cancer ([place])    Cataract    Diabetic retinopathy ([place])    Hypertension    Hypertensive retinopathy    Past Surgical History:  Procedure Laterality Date   ACHILLES TENDON SURGERY     ANKLE SURGERY     CARPAL TUNNEL WITH CUBITAL TUNNEL     MOHS SURGERY     has had 20+ surgeries for this on different areas of body   FAMILY HISTORY Family History  Problem Relation Age of Onset   Hypertension Mother    Diabetes Mother    Cataracts Mother    Cancer Paternal Grandmother    SOCIAL HISTORY Social History   Tobacco Use   Smoking status: Never   Smokeless tobacco: Never  Vaping Use   Vaping Use: Never used  Substance Use Topics   Alcohol use: Not Currently  Drug use: Not Currently       OPHTHALMIC EXAM: Base Eye Exam     Visual Acuity (Snellen - Linear)       Right Left   Dist cc 20/20 -2 20/30 -1   Dist ph cc  NI    Correction: Glasses         Tonometry (Tonopen, 2:52 PM)       Right Left   Pressure 15 20         Pupils       Dark Light Shape React APD   Right 3 2 Round Brisk None   Left 3 2 Round Brisk None         Visual Fields (Counting fingers)       Left Right    Full Full         Extraocular Movement       Right Left    Full, Ortho Full, Ortho         Neuro/Psych     Oriented x3: Yes    Mood/Affect: Normal         Dilation     Both eyes: 1.0% Mydriacyl, 2.5% Phenylephrine @ 2:52 PM           Slit Lamp and Fundus Exam     Slit Lamp Exam       Right Left   Lids/Lashes Dermatochalasis - upper lid Dermatochalasis - upper lid   Conjunctiva/Sclera White and quiet White and quiet   Cornea arcus arcus   Anterior Chamber deep, clear, narrow temporal angle deep, clear, narrow temporal angle   Iris Round and dilated, No NVI Round and dilated, No NVI   Lens Clear Trace Posterior subcapsular cataract   Anterior Vitreous mild syneresis mild syneresis         Fundus Exam       Right Left   Disc Pink and Sharp mild Pallor, Sharp rim, mild PPA   C/D Ratio 0.2 0.2   Macula Flat, good foveal reflex, focal MA/DBH with exudates and cystic changes ST to fovea -- slightly improved, good focal laser changes, single prominent IRH ST mac Blunted foveal reflex, circinate exudates and edema temporal macula -- improved, scattered MA/DBH - improving -- good targets for focal laser   Vessels mild attenuation, mild tortuosity attenuated, mild tortuousity, scattered, early NV greatest superior to disc--regressing   Periphery Attached, 360 DBH, patches of NVE, good 360 PRP, room for fill in temporally and posteriorly if needed Attached, 360 DBH, patches of NVE regressing, good 360 PRP           Refraction     Wearing Rx       Sphere Cylinder Add   Right +1.75 Sphere +2.50   Left +1.75 Sphere +2.50           IMAGING AND PROCEDURES  Imaging and Procedures for 04/25/2022  OCT, Retina - OU - Both Eyes       Right Eye Quality was good. Central Foveal Thickness: 261. Progression has been stable. Findings include normal foveal contour, no SRF, abnormal foveal contour, intraretinal hyper-reflective material, intraretinal fluid, vitreomacular adhesion (Persistent cystic changes and IRHM temporal macula ).   Left Eye Quality was good. Central Foveal Thickness: 226.  Progression has improved. Findings include no SRF, abnormal foveal contour, intraretinal hyper-reflective material, intraretinal fluid, vitreomacular adhesion (interval improvement in IRF and IRHM temporal macula).   Notes *Images captured and stored on drive  Diagnosis / Impression:  +DME OU  OD: Persistent cystic changes and IRHM temporal macula  OS: interval improvement in IRF and IRHM temporal macula  Clinical management:  See below  Abbreviations: NFP - Normal foveal profile. CME - cystoid macular edema. PED - pigment epithelial detachment. IRF - intraretinal fluid. SRF - subretinal fluid. EZ - ellipsoid zone. ERM - epiretinal membrane. ORA - outer retinal atrophy. ORT - outer retinal tubulation. SRHM - subretinal hyper-reflective material. IRHM - intraretinal hyper-reflective material      Focal Laser - OS - Left Eye       Time Out Confirmed correct patient, procedure, site, and patient consented.   Notes LASER PROCEDURE NOTE  Diagnosis:   Diabetic macular edema, LEFT EYE  Procedure:  Focal laser photocoagulation using slit lamp laser,  LEFT EYE  Anesthesia:  Topical  Surgeon: [name], MD, PhD   Informed consent obtained, operative eye marked, and time out performed prior to initiation of laser.   Lumenis [id number] Focal/Grid laser Power: 110 mW Duration: 50 msec  Spot size: 100 microns  # spots: 142 spots placed to MAs in temporal macula  Complications: None.  RTC: Feb 5 or later -- DFE/OCT, possible injection(s)  Patient tolerated the procedure well and received written and verbal post-procedure care information/education.             ASSESSMENT/PLAN:   ICD-10-CM   1. Proliferative diabetic retinopathy of both eyes with macular edema associated with type 2 diabetes mellitus (HCC)  E11.3513 OCT, Retina - OU - Both Eyes    Focal Laser - OS - Left Eye    2. Essential hypertension  I10     3. Hypertensive retinopathy of both eyes  H35.033      4. Combined forms of age-related cataract of both eyes  H25.813      1. Proliferative diabetic retinopathy, both eyes  - FA (07.12.22) shows scattered patches of NVE OU - s/p PRP OD (07.20.22)  - s/p PRP OS (08.03.22)  - s/p IVA OU #1 (08.17.22), #2 (09.14.22), #3 (10.12.22), #4 (11.09.22), #5 (12.21.22), #6 (01.20.23), #7 (02.20.23), #8 (03.29.23), #9 (04.26.23) -- mild IVA resistance OS - s/p IVA OD #10 (05.24.23), #11 (07.10.23), #12 (08.11.23) - s/p focal laser OD (08.21.23)  - s/p IVE OS #1 (05.24.23), #2 (07.10.23), #3 (08.11.23), #4 (09.08.23), #5 (09.08.23), #6 (11.06.23), #7 (12.04.23), #8 (01.08.24)  - history of poor BG control -- now improved on Monjouro  - BCVA OD: 20/20; OS: 20/30 (stable) - OCT shows OD: Persistent cystic changes and IRHM temporal macula; OS: Mild interval improvement in IRF and IRHM temporal macula - recommend focal laser OS - pt wishes to proceed with focal laser - RBA of procedure discussed, questions answered - informed consent obtained and signed - see procedure note - Avastin, informed consent obtained and signed, 08.17.2022 (OU) - Eylea, informed consent obtained and signed, 05.24.23 (OS) - start PF QID OS x7 days - f/u February 5 or later, DFE, OCT, possible injection(s)  2,3. Hypertensive retinopathy OU - discussed importance of tight BP control - monitor  4.   Mixed Cataract OU - The symptoms of cataract, surgical options, and treatments and risks were discussed with patient. - discussed diagnosis and progression - monitor  Ophthalmic Meds Ordered this visit:  Meds ordered this encounter  Medications   prednisoLONE acetate (PRED FORTE) 1 % ophthalmic suspension    Sig: Place 1 drop into the left eye 4 (four) times daily for 7 days.    Dispense:  10 mL  Refill:  0     Return for f/u February 5th or later -, Dilated Exam, OCT, Possible Injxn.  There are no Patient Instructions on file for this visit.  This document serves as a  record of services personally performed by Gardiner Sleeper, MD, PhD. It was created on their behalf by Orvan Falconer, an ophthalmic technician. The creation of this record is the provider's dictation and/or activities during the visit.    Electronically signed by: Orvan Falconer, OA, 04/25/22  4:55 PM  This document serves as a record of services personally performed by Gardiner Sleeper, MD, PhD. It was created on their behalf by San Jetty. Owens Shark, OA an ophthalmic technician. The creation of this record is the provider's dictation and/or activities during the visit.    Electronically signed by: San Jetty. Owens Shark, [place] 01.29.2024 4:55 PM   Gardiner Sleeper, M.D., Ph.D. Diseases & Surgery of the Retina and Vitreous [facility]  I have reviewed the above documentation for accuracy and completeness, and I agree with the above. Gardiner Sleeper, M.D., Ph.D. 04/25/22 4:55 PM   Abbreviations: M myopia (nearsighted); A astigmatism; H hyperopia (farsighted); P presbyopia; Mrx spectacle prescription;  CTL contact lenses; OD right eye; OS left eye; OU both eyes  XT exotropia; ET esotropia; PEK punctate epithelial keratitis; PEE punctate epithelial erosions; DES dry eye syndrome; MGD meibomian gland dysfunction; ATs artificial tears; PFAT's preservative free artificial tears; [place] nuclear sclerotic cataract; PSC posterior subcapsular cataract; ERM epi-retinal membrane; PVD posterior vitreous detachment; RD retinal detachment; DM diabetes mellitus; DR diabetic retinopathy; NPDR non-proliferative diabetic retinopathy; PDR proliferative diabetic retinopathy; CSME clinically significant macular edema; DME diabetic macular edema; dbh dot blot hemorrhages; CWS cotton wool spot; POAG primary open angle glaucoma; C/D cup-to-disc ratio; HVF humphrey visual field; GVF goldmann visual field; OCT optical coherence tomography; IOP intraocular pressure; BRVO Branch retinal vein occlusion; CRVO central  retinal vein occlusion; CRAO central retinal artery occlusion; BRAO branch retinal artery occlusion; RT retinal tear; SB scleral buckle; PPV pars plana vitrectomy; VH Vitreous hemorrhage; PRP panretinal laser photocoagulation; IVK intravitreal kenalog; VMT vitreomacular traction; MH Macular hole;  NVD neovascularization of the disc; NVE neovascularization elsewhere; AREDS age related eye disease study; ARMD age related macular degeneration; POAG primary open angle glaucoma; EBMD epithelial/anterior basement membrane dystrophy; ACIOL anterior chamber intraocular lens; IOL intraocular lens; PCIOL posterior chamber intraocular lens; Phaco/IOL phacoemulsification with intraocular lens placement; [place] photorefractive keratectomy; LASIK laser assisted in situ keratomileusis; HTN hypertension; DM diabetes mellitus; COPD chronic obstructive pulmonary disease

## 2022-04-25 ENCOUNTER — Ambulatory Visit (INDEPENDENT_AMBULATORY_CARE_PROVIDER_SITE_OTHER): Payer: BC Managed Care – PPO | Admitting: Ophthalmology

## 2022-04-25 ENCOUNTER — Encounter (INDEPENDENT_AMBULATORY_CARE_PROVIDER_SITE_OTHER): Payer: Self-pay | Admitting: Ophthalmology

## 2022-04-25 DIAGNOSIS — E113513 Type 2 diabetes mellitus with proliferative diabetic retinopathy with macular edema, bilateral: Secondary | ICD-10-CM | POA: Diagnosis not present

## 2022-04-25 DIAGNOSIS — H35033 Hypertensive retinopathy, bilateral: Secondary | ICD-10-CM | POA: Diagnosis not present

## 2022-04-25 DIAGNOSIS — I10 Essential (primary) hypertension: Secondary | ICD-10-CM

## 2022-04-25 DIAGNOSIS — H25813 Combined forms of age-related cataract, bilateral: Secondary | ICD-10-CM

## 2022-04-25 MED ORDER — PREDNISOLONE ACETATE 1 % OP SUSP: 1.0000 [drp] | Freq: Four times a day (QID) | OPHTHALMIC | 0 refills | Status: AC

## 2022-05-03 NOTE — Progress Notes (Signed)
[facility] Note  05/09/2022    CHIEF COMPLAINT Patient presents for Retina Follow Up  HISTORY OF PRESENT ILLNESS: Jason Bowen is a 62 y.o. male who presents to the clinic today for:    HPI     Retina Follow Up   Patient presents with  Diabetic Retinopathy.  In both eyes.  This started 2 weeks ago.  I, the attending physician,  performed the HPI with the patient and updated documentation appropriately.        Comments   Patient here for 2 weeks retina follow up for PDR OU. Patient states vision doing good. No eye pain.       Last edited by [name], MD on 05/09/2022  1:39 PM.    Pt states he has not noticed any change in vision since he had focal laser 2 weeks ago   Referring physician: No referring provider defined for this encounter.  HISTORICAL INFORMATION:   Selected notes from the MEDICAL RECORD NUMBER Referred by [name] for eval of DR w/DME OS   CURRENT MEDICATIONS: No current outpatient medications on file. (Ophthalmic Drugs)   No current facility-administered medications for this visit. (Ophthalmic Drugs)   Current Outpatient Medications (Other)  Medication Sig   dapagliflozin propanediol (FARXIGA) 10 MG TABS tablet Take 10 mg by mouth daily.   Insulin Degludec (TRESIBA) 100 UNIT/ML SOLN Inject 20 Units into the skin daily.   insulin lispro (HUMALOG) 100 UNIT/ML injection Inject 5 Units into the skin 3 (three) times daily before meals.   lisinopril (ZESTRIL) 20 MG tablet Take 20 mg by mouth daily.   tirzepatide [facility]) 5 MG/0.5ML Pen Inject 5 mg into the skin once a week.   lisinopril (ZESTRIL) 20 MG tablet 1 tablet (Patient not taking: Reported on 12/09/2020)   metFORMIN (GLUCOPHAGE) 500 MG tablet Take 500 mg by mouth 2 (two) times daily with a meal. (Patient not taking: Reported on 04/25/2022)   metFORMIN (GLUCOPHAGE-XR) 500 MG 24 hr tablet 1 tablet with evening meal (Patient not taking: Reported on 12/09/2020)    triamcinolone cream (KENALOG) 0.1 % SMARTSIG:1 Application Topical 2-3 Times Daily (Patient not taking: Reported on 04/25/2022)   No current facility-administered medications for this visit. (Other)   REVIEW OF SYSTEMS: ROS   Positive for: Endocrine, Eyes Negative for: Constitutional, Gastrointestinal, Neurological, Skin, Genitourinary, Musculoskeletal, HENT, Cardiovascular, Respiratory, Psychiatric, Allergic/Imm, Heme/Lymph Last edited by Theodore Demark, COA on 05/09/2022  1:11 PM.     ALLERGIES No Known Allergies  PAST MEDICAL HISTORY Past Medical History:  Diagnosis Date   Cancer ([place])    Cataract    Diabetic retinopathy ([place])    Hypertension    Hypertensive retinopathy    Past Surgical History:  Procedure Laterality Date   ACHILLES TENDON SURGERY     ANKLE SURGERY     CARPAL TUNNEL WITH CUBITAL TUNNEL     MOHS SURGERY     has had 20+ surgeries for this on different areas of body   FAMILY HISTORY Family History  Problem Relation Age of Onset   Hypertension Mother    Diabetes Mother    Cataracts Mother    Cancer Paternal Grandmother    SOCIAL HISTORY Social History   Tobacco Use   Smoking status: Never   Smokeless tobacco: Never  Vaping Use   Vaping Use: Never used  Substance Use Topics   Alcohol use: Not Currently   Drug use: Not Currently  OPHTHALMIC EXAM: Base Eye Exam     Visual Acuity (Snellen - Linear)       Right Left   Dist cc 20/20 -2 20/30 -1   Dist ph cc  20/25 -2    Correction: Glasses         Tonometry (Tonopen, 1:09 PM)       Right Left   Pressure 13 16         Pupils       Dark Light Shape React APD   Right 3 2 Round Brisk None   Left 3 2 Round Brisk None         Visual Fields (Counting fingers)       Left Right    Full Full         Extraocular Movement       Right Left    Full, Ortho Full, Ortho         Neuro/Psych     Oriented x3: Yes   Mood/Affect: Normal         Dilation     Both  eyes: 1.0% Mydriacyl, 2.5% Phenylephrine @ 1:09 PM           Slit Lamp and Fundus Exam     Slit Lamp Exam       Right Left   Lids/Lashes Dermatochalasis - upper lid Dermatochalasis - upper lid   Conjunctiva/Sclera White and quiet White and quiet   Cornea arcus arcus   Anterior Chamber deep, clear, narrow temporal angle deep, clear, narrow temporal angle   Iris Round and dilated, No NVI Round and dilated, No NVI   Lens Clear 1+ central Posterior subcapsular cataract   Anterior Vitreous mild syneresis mild syneresis         Fundus Exam       Right Left   Disc Pink and Sharp mild Pallor, Sharp rim, mild PPA   C/D Ratio 0.2 0.2   Macula Flat, good foveal reflex, focal MA/DBH with exudates and cystic changes ST to fovea -- slightly improved, good focal laser changes, single prominent IRH ST mac -- improving Blunted foveal reflex, circinate exudates and edema temporal macula -- improved, scattered MA/DBH - improving, good early focal laser changes   Vessels attenuated, mild tortuosity attenuated, mild tortuousity, scattered, early NV greatest superior to disc--regressing   Periphery Attached, scattered MA posteriorly, patches of NVE, good 360 PRP, room for fill in temporally and posteriorly if needed Attached, scattered MA greatest posteriorly, patches of NVE regressing, good 360 PRP           Refraction     Wearing Rx       Sphere Cylinder Add   Right +1.75 Sphere +2.50   Left +1.75 Sphere +2.50           IMAGING AND PROCEDURES  Imaging and Procedures for 05/09/2022  OCT, Retina - OU - Both Eyes       Right Eye Quality was good. Central Foveal Thickness: 262. Progression has improved. Findings include normal foveal contour, no SRF, abnormal foveal contour, intraretinal hyper-reflective material, intraretinal fluid, vitreomacular adhesion (Persistent cystic changes and IRHM temporal macula -- improved).   Left Eye Quality was good. Central Foveal Thickness: 228.  Progression has improved. Findings include no SRF, abnormal foveal contour, intraretinal hyper-reflective material, intraretinal fluid, vitreomacular adhesion (interval improvement in IRF and IRHM temporal macula, patchy ORA from focal laser).   Notes *Images captured and stored on drive  Diagnosis / Impression:  +DME OU  OD: Persistent cystic changes and IRHM temporal macula -- improved OS: interval improvement in IRF and IRHM temporal macula,  patchy ORA from focal laser  Clinical management:  See below  Abbreviations: NFP - Normal foveal profile. CME - cystoid macular edema. PED - pigment epithelial detachment. IRF - intraretinal fluid. SRF - subretinal fluid. EZ - ellipsoid zone. ERM - epiretinal membrane. ORA - outer retinal atrophy. ORT - outer retinal tubulation. SRHM - subretinal hyper-reflective material. IRHM - intraretinal hyper-reflective material            ASSESSMENT/PLAN:   ICD-10-CM   1. Proliferative diabetic retinopathy of both eyes with macular edema associated with type 2 diabetes mellitus ([place])  E11.3513 OCT, Retina - OU - Both Eyes    2. Essential hypertension  I10     3. Hypertensive retinopathy of both eyes  H35.033     4. Combined forms of age-related cataract of both eyes  H25.813       1. Proliferative diabetic retinopathy, both eyes  - FA (07.12.22) shows scattered patches of NVE OU - s/p PRP OD (07.20.22)  - s/p PRP OS (08.03.22)  - s/p IVA OU #1 (08.17.22), #2 (09.14.22), #3 (10.12.22), #4 (11.09.22), #5 (12.21.22), #6 (01.20.23), #7 (02.20.23), #8 (03.29.23), #9 (04.26.23) -- mild IVA resistance OS - s/p IVA OD #10 (05.24.23), #11 (07.10.23), #12 (08.11.23) - s/p focal laser OD (08.21.23), OS (01.29.24)  - s/p IVE OS #1 (05.24.23), #2 (07.10.23), #3 (08.11.23), #4 (09.08.23), #5 (09.08.23), #6 (11.06.23), #7 (12.04.23), #8 (01.08.24)  - history of poor BG control -- now improved on Monjouro  - BCVA OD: 20/20; OS: 20/25 (improved) - OCT shows  OD: Persistent cystic changes and IRHM temporal macula; OS: Mild interval improvement in IRF and IRHM temporal macula, patchy ORA from focal laser - no treatment recommended today -- pt in agreement - Avastin informed consent obtained and signed, 08.17.2022 (OU) [facility] informed consent obtained and signed, 05.24.23 (OS) - f/u 6 weeks, DFE, OCT, possible injection(s)  2,3. Hypertensive retinopathy OU - discussed importance of tight BP control - monitor  4.   Mixed Cataract OU - The symptoms of cataract, surgical options, and treatments and risks were discussed with patient. - discussed diagnosis and progression - monitor  Ophthalmic Meds Ordered this visit:  No orders of the defined types were placed in this encounter.    Return in about 6 weeks (around 06/20/2022) for f/u PDR OU, DFE, OCT.  There are no Patient Instructions on file for this visit.  This document serves as a record of services personally performed by Gardiner Sleeper, MD, PhD. It was created on their behalf by Orvan Falconer, an ophthalmic technician. The creation of this record is the provider's dictation and/or activities during the visit.    Electronically signed by: Orvan Falconer, OA, 05/10/22  1:57 AM  This document serves as a record of services personally performed by Gardiner Sleeper, MD, PhD. It was created on their behalf by San Jetty. Owens Shark, OA an ophthalmic technician. The creation of this record is the provider's dictation and/or activities during the visit.    Electronically signed by: San Jetty. Owens Shark, [place] 02.12.2024 1:57 AM  Gardiner Sleeper, M.D., Ph.D. Diseases & Surgery of the Retina and Vitreous [facility]  I have reviewed the above documentation for accuracy and completeness, and I agree with the above. Gardiner Sleeper, M.D., Ph.D. 05/10/22 1:58 AM  Abbreviations: M myopia (nearsighted); A astigmatism; H hyperopia (  farsighted); P presbyopia; Mrx spectacle prescription;   CTL contact lenses; OD right eye; OS left eye; OU both eyes  XT exotropia; ET esotropia; PEK punctate epithelial keratitis; PEE punctate epithelial erosions; DES dry eye syndrome; MGD meibomian gland dysfunction; ATs artificial tears; PFAT's preservative free artificial tears; [place] nuclear sclerotic cataract; PSC posterior subcapsular cataract; ERM epi-retinal membrane; PVD posterior vitreous detachment; RD retinal detachment; DM diabetes mellitus; DR diabetic retinopathy; NPDR non-proliferative diabetic retinopathy; PDR proliferative diabetic retinopathy; CSME clinically significant macular edema; DME diabetic macular edema; dbh dot blot hemorrhages; CWS cotton wool spot; POAG primary open angle glaucoma; C/D cup-to-disc ratio; HVF humphrey visual field; GVF goldmann visual field; OCT optical coherence tomography; IOP intraocular pressure; BRVO Branch retinal vein occlusion; CRVO central retinal vein occlusion; CRAO central retinal artery occlusion; BRAO branch retinal artery occlusion; RT retinal tear; SB scleral buckle; PPV pars plana vitrectomy; VH Vitreous hemorrhage; PRP panretinal laser photocoagulation; IVK intravitreal kenalog; VMT vitreomacular traction; MH Macular hole;  NVD neovascularization of the disc; NVE neovascularization elsewhere; AREDS age related eye disease study; ARMD age related macular degeneration; POAG primary open angle glaucoma; EBMD epithelial/anterior basement membrane dystrophy; ACIOL anterior chamber intraocular lens; IOL intraocular lens; PCIOL posterior chamber intraocular lens; Phaco/IOL phacoemulsification with intraocular lens placement; [place] photorefractive keratectomy; LASIK laser assisted in situ keratomileusis; HTN hypertension; DM diabetes mellitus; COPD chronic obstructive pulmonary disease

## 2022-05-09 ENCOUNTER — Ambulatory Visit (INDEPENDENT_AMBULATORY_CARE_PROVIDER_SITE_OTHER): Payer: BC Managed Care – PPO | Admitting: Ophthalmology

## 2022-05-09 ENCOUNTER — Encounter (INDEPENDENT_AMBULATORY_CARE_PROVIDER_SITE_OTHER): Payer: Self-pay | Admitting: Ophthalmology

## 2022-05-09 DIAGNOSIS — I10 Essential (primary) hypertension: Secondary | ICD-10-CM | POA: Diagnosis not present

## 2022-05-09 DIAGNOSIS — H25813 Combined forms of age-related cataract, bilateral: Secondary | ICD-10-CM

## 2022-05-09 DIAGNOSIS — E113513 Type 2 diabetes mellitus with proliferative diabetic retinopathy with macular edema, bilateral: Secondary | ICD-10-CM

## 2022-05-09 DIAGNOSIS — H35033 Hypertensive retinopathy, bilateral: Secondary | ICD-10-CM

## 2022-05-24 DIAGNOSIS — M25862 Other specified joint disorders, left knee: Secondary | ICD-10-CM | POA: Diagnosis not present

## 2022-05-26 ENCOUNTER — Other Ambulatory Visit: Payer: Self-pay | Admitting: Family Medicine

## 2022-05-26 DIAGNOSIS — M25862 Other specified joint disorders, left knee: Secondary | ICD-10-CM

## 2022-06-06 ENCOUNTER — Ambulatory Visit
Admission: RE | Admit: 2022-06-06 | Discharge: 2022-06-06 | Disposition: A | Discharge status: Home / Self Care | Payer: BC Managed Care – PPO | Source: Ambulatory Visit | Attending: Family Medicine | Admitting: Family Medicine

## 2022-06-06 DIAGNOSIS — R2242 Localized swelling, mass and lump, left lower limb: Secondary | ICD-10-CM | POA: Diagnosis not present

## 2022-06-06 DIAGNOSIS — M25862 Other specified joint disorders, left knee: Secondary | ICD-10-CM

## 2022-06-10 NOTE — Progress Notes (Signed)
[facility] Note  06/20/2022    CHIEF COMPLAINT Patient presents for Retina Follow Up  HISTORY OF PRESENT ILLNESS: Jason Bowen is a 62 y.o. male who presents to the clinic today for:    HPI     Retina Follow Up   Patient presents with  Diabetic Retinopathy.  In both eyes.  This started weeks ago.  Duration of 6 weeks.  I, the attending physician,  performed the HPI with the patient and updated documentation appropriately.        Comments   Patient states that the vision is doing well and unchanged. He is not using any eye drops at this time. His blood sugar was 87.      Last edited by [name], MD on 06/20/2022  9:07 PM.    Patient states vision is doing good and unchanged.   Referring physician: No referring provider defined for this encounter.  HISTORICAL INFORMATION:   Selected notes from the MEDICAL RECORD NUMBER Referred by [name] for eval of DR w/DME OS   CURRENT MEDICATIONS: No current outpatient medications on file. (Ophthalmic Drugs)   No current facility-administered medications for this visit. (Ophthalmic Drugs)   Current Outpatient Medications (Other)  Medication Sig   dapagliflozin propanediol (FARXIGA) 10 MG TABS tablet Take 10 mg by mouth daily.   Insulin Degludec (TRESIBA) 100 UNIT/ML SOLN Inject 20 Units into the skin daily.   insulin lispro (HUMALOG) 100 UNIT/ML injection Inject 5 Units into the skin 3 (three) times daily before meals.   lisinopril (ZESTRIL) 20 MG tablet Take 20 mg by mouth daily.   lisinopril (ZESTRIL) 20 MG tablet 1 tablet (Patient not taking: Reported on 12/09/2020)   metFORMIN (GLUCOPHAGE) 500 MG tablet Take 500 mg by mouth 2 (two) times daily with a meal. (Patient not taking: Reported on 04/25/2022)   metFORMIN (GLUCOPHAGE-XR) 500 MG 24 hr tablet 1 tablet with evening meal (Patient not taking: Reported on 12/09/2020)   tirzepatide (MOUNJARO) 5 MG/0.5ML Pen Inject 5 mg into the skin once a  week.   triamcinolone cream (KENALOG) 0.1 % SMARTSIG:1 Application Topical 2-3 Times Daily (Patient not taking: Reported on 04/25/2022)   No current facility-administered medications for this visit. (Other)   REVIEW OF SYSTEMS: ROS   Positive for: Endocrine, Eyes Negative for: Constitutional, Gastrointestinal, Neurological, Skin, Genitourinary, Musculoskeletal, HENT, Cardiovascular, Respiratory, Psychiatric, Allergic/Imm, Heme/Lymph Last edited by Annie Paras, COT on 06/20/2022  1:46 PM.      ALLERGIES No Known Allergies  PAST MEDICAL HISTORY Past Medical History:  Diagnosis Date   Cancer ([place])    Cataract    Diabetic retinopathy ([place])    Hypertension    Hypertensive retinopathy    Past Surgical History:  Procedure Laterality Date   ACHILLES TENDON SURGERY     ANKLE SURGERY     CARPAL TUNNEL WITH CUBITAL TUNNEL     MOHS SURGERY     has had 20+ surgeries for this on different areas of body   FAMILY HISTORY Family History  Problem Relation Age of Onset   Hypertension Mother    Diabetes Mother    Cataracts Mother    Cancer Paternal Grandmother    SOCIAL HISTORY Social History   Tobacco Use   Smoking status: Never   Smokeless tobacco: Never  Vaping Use   Vaping Use: Never used  Substance Use Topics   Alcohol use: Not Currently   Drug use: Not Currently  OPHTHALMIC EXAM: Base Eye Exam     Visual Acuity (Snellen - Linear)       Right Left   Dist cc 20/20 +2 20/30 +2   Dist ph cc  NI    Correction: Glasses         Tonometry (Tonopen, 1:49 PM)       Right Left   Pressure 17 19         Pupils       Dark Light Shape React APD   Right 3 2 Round Brisk None   Left 3 2 Round Brisk None         Visual Fields       Left Right    Full Full         Extraocular Movement       Right Left    Full, Ortho Full, Ortho         Neuro/Psych     Oriented x3: Yes   Mood/Affect: Normal         Dilation     Both eyes: 1.0%  Mydriacyl, 2.5% Phenylephrine @ 1:47 PM           Slit Lamp and Fundus Exam     Slit Lamp Exam       Right Left   Lids/Lashes Dermatochalasis - upper lid Dermatochalasis - upper lid   Conjunctiva/Sclera White and quiet White and quiet   Cornea arcus arcus   Anterior Chamber deep, clear, narrow temporal angle deep, clear, narrow temporal angle   Iris Round and dilated, No NVI Round and dilated, No NVI   Lens 2+ NS, 2+ cortical 2+ central Posterior subcapsular cataract, 2+ NS   Anterior Vitreous mild syneresis mild syneresis         Fundus Exam       Right Left   Disc Pink and Sharp mild Pallor, Sharp rim, mild PPA   C/D Ratio 0.2 0.2   Macula Flat, good foveal reflex, focal MA/DBH with exudates and cystic changes ST to fovea -- slightly improved, good focal laser changes, single prominent IRH ST mac -- improving Blunted foveal reflex, circinate exudates and edema temporal macula -- improved, scattered MA/DBH - improving, good focal laser changes   Vessels attenuated, mild tortuosity attenuated, mild tortuousity, scattered, early NV greatest superior to disc--regressing   Periphery Attached, scattered MA posteriorly, patches of NVE, good 360 PRP, room for fill in temporally and posteriorly if needed Attached, scattered MA greatest posteriorly, patches of NVE regressing, good 360 PRP           Refraction     Wearing Rx       Sphere Cylinder Add   Right +1.75 Sphere +2.50   Left +1.75 Sphere +2.50           IMAGING AND PROCEDURES  Imaging and Procedures for 06/20/2022  OCT, Retina - OU - Both Eyes       Right Eye Quality was good. Central Foveal Thickness: 265. Progression has improved. Findings include normal foveal contour, no SRF, intraretinal hyper-reflective material, intraretinal fluid, vitreomacular adhesion (tr cystic changes and IRHM temporal macula -- slightly improved).   Left Eye Quality was good. Central Foveal Thickness: 230. Progression has  improved. Findings include no SRF, abnormal foveal contour, intraretinal hyper-reflective material, intraretinal fluid, vitreomacular adhesion (interval improvement in IRF and IRHM temporal macula, patchy ORA from focal laser).   Notes *Images captured and stored on drive  Diagnosis / Impression:  +DME OU OD:  tr cystic changes and IRHM temporal macula -- improved OS: interval improvement in IRF and IRHM temporal macula, patchy ORA from focal laser  Clinical management:  See below  Abbreviations: NFP - Normal foveal profile. CME - cystoid macular edema. PED - pigment epithelial detachment. IRF - intraretinal fluid. SRF - subretinal fluid. EZ - ellipsoid zone. ERM - epiretinal membrane. ORA - outer retinal atrophy. ORT - outer retinal tubulation. SRHM - subretinal hyper-reflective material. IRHM - intraretinal hyper-reflective material            ASSESSMENT/PLAN:   ICD-10-CM   1. Proliferative diabetic retinopathy of both eyes with macular edema associated with type 2 diabetes mellitus ([place])  E11.3513 OCT, Retina - OU - Both Eyes    2. Essential hypertension  I10     3. Hypertensive retinopathy of both eyes  H35.033     4. Combined forms of age-related cataract of both eyes  H25.813      1. Proliferative diabetic retinopathy, both eyes  - FA (07.12.22) shows scattered patches of NVE OU - s/p PRP OD (07.20.22)  - s/p PRP OS (08.03.22)  - s/p IVA OU #1 (08.17.22), #2 (09.14.22), #3 (10.12.22), #4 (11.09.22), #5 (12.21.22), #6 (01.20.23), #7 (02.20.23), #8 (03.29.23), #9 (04.26.23) -- mild IVA resistance OS - s/p IVA OD #10 (05.24.23), #11 (07.10.23), #12 (08.11.23) - s/p focal laser OD (08.21.23), OS (01.29.24)  - s/p IVE OS #1 (05.24.23), #2 (07.10.23), #3 (08.11.23), #4 (09.08.23), #5 (09.08.23), #6 (11.06.23), #7 (12.04.23), #8 (01.08.24)  - history of poor BG control -- now improved on Monjou[place]  - BCVA OD: 20/20; OS: 20/30+2 (stable) - OCT shows OD: Persistent cystic changes  and IRHM temporal macula--slightly improved; OS:  interval improvement in IRF and IRHM temporal macula, patchy ORA from focal laser - no treatment recommended today -- pt in agreement - [place] informed consent obtained and signed, 08.17.2022 (OU) [facility] informed consent obtained and signed, 05.24.23 (OS) - f/u 2-3 months DFE, OCT, FA (transit OS)  2,3. Hypertensive retinopathy OU - discussed importance of tight BP control - monitor  4. Mixed Cataract OU - The symptoms of cataract, surgical options, and treatments and risks were discussed with patient. - discussed diagnosis and progression - monitor  Ophthalmic Meds Ordered this visit:  No orders of the defined types were placed in this encounter.    Return 2-3 months, for DFE, OCT, FA (transit OS).  There are no Patient Instructions on file for this visit.  This document serves as a record of services personally performed by Gardiner Sleeper, MD, PhD. It was created on their behalf by Orvan Falconer, an ophthalmic technician. The creation of this record is the provider's dictation and/or activities during the visit.    Electronically signed by: Orvan Falconer, OA, 06/20/22  9:46 PM  This document serves as a record of services personally performed by Gardiner Sleeper, MD, PhD. It was created on their behalf by Roselee Nova, COMT. The creation of this record is the provider's dictation and/or activities during the visit.  Electronically signed by: Roselee Nova, COMT 06/20/22 9:46 PM  Gardiner Sleeper, M.D., Ph.D. Diseases & Surgery of the Retina and Vitreous [facility]  I have reviewed the above documentation for accuracy and completeness, and I agree with the above. Gardiner Sleeper, M.D., Ph.D. 06/20/22 9:51 PM   Abbreviations: M myopia (nearsighted); A astigmatism; H hyperopia (farsighted); P presbyopia; Mrx spectacle prescription;  CTL contact lenses; OD right eye;  OS left eye; OU both eyes  XT  exotropia; ET esotropia; PEK punctate epithelial keratitis; PEE punctate epithelial erosions; DES dry eye syndrome; MGD meibomian gland dysfunction; ATs artificial tears; PFAT's preservative free artificial tears; [place] nuclear sclerotic cataract; PSC posterior subcapsular cataract; ERM epi-retinal membrane; PVD posterior vitreous detachment; RD retinal detachment; DM diabetes mellitus; DR diabetic retinopathy; NPDR non-proliferative diabetic retinopathy; PDR proliferative diabetic retinopathy; CSME clinically significant macular edema; DME diabetic macular edema; dbh dot blot hemorrhages; CWS cotton wool spot; POAG primary open angle glaucoma; C/D cup-to-disc ratio; HVF humphrey visual field; GVF goldmann visual field; OCT optical coherence tomography; IOP intraocular pressure; BRVO Branch retinal vein occlusion; CRVO central retinal vein occlusion; CRAO central retinal artery occlusion; BRAO branch retinal artery occlusion; RT retinal tear; SB scleral buckle; PPV pars plana vitrectomy; VH Vitreous hemorrhage; PRP panretinal laser photocoagulation; IVK intravitreal kenalog; VMT vitreomacular traction; MH Macular hole;  NVD neovascularization of the disc; NVE neovascularization elsewhere; AREDS age related eye disease study; ARMD age related macular degeneration; POAG primary open angle glaucoma; EBMD epithelial/anterior basement membrane dystrophy; ACIOL anterior chamber intraocular lens; IOL intraocular lens; PCIOL posterior chamber intraocular lens; Phaco/IOL phacoemulsification with intraocular lens placement; [place] photorefractive keratectomy; LASIK laser assisted in situ keratomileusis; HTN hypertension; DM diabetes mellitus; COPD chronic obstructive pulmonary disease

## 2022-06-20 ENCOUNTER — Ambulatory Visit (INDEPENDENT_AMBULATORY_CARE_PROVIDER_SITE_OTHER): Payer: BC Managed Care – PPO | Admitting: Ophthalmology

## 2022-06-20 ENCOUNTER — Encounter (INDEPENDENT_AMBULATORY_CARE_PROVIDER_SITE_OTHER): Payer: Self-pay | Admitting: Ophthalmology

## 2022-06-20 DIAGNOSIS — H25813 Combined forms of age-related cataract, bilateral: Secondary | ICD-10-CM

## 2022-06-20 DIAGNOSIS — I10 Essential (primary) hypertension: Secondary | ICD-10-CM

## 2022-06-20 DIAGNOSIS — E113513 Type 2 diabetes mellitus with proliferative diabetic retinopathy with macular edema, bilateral: Secondary | ICD-10-CM

## 2022-06-20 DIAGNOSIS — H35033 Hypertensive retinopathy, bilateral: Secondary | ICD-10-CM

## 2022-07-08 DIAGNOSIS — H52223 Regular astigmatism, bilateral: Secondary | ICD-10-CM | POA: Diagnosis not present

## 2022-07-08 DIAGNOSIS — H524 Presbyopia: Secondary | ICD-10-CM | POA: Diagnosis not present

## 2022-07-08 DIAGNOSIS — E113513 Type 2 diabetes mellitus with proliferative diabetic retinopathy with macular edema, bilateral: Secondary | ICD-10-CM | POA: Diagnosis not present

## 2022-07-08 DIAGNOSIS — H31093 Other chorioretinal scars, bilateral: Secondary | ICD-10-CM | POA: Diagnosis not present

## 2022-07-08 DIAGNOSIS — H25813 Combined forms of age-related cataract, bilateral: Secondary | ICD-10-CM | POA: Diagnosis not present

## 2022-07-08 DIAGNOSIS — H5203 Hypermetropia, bilateral: Secondary | ICD-10-CM | POA: Diagnosis not present

## 2022-07-22 DIAGNOSIS — R599 Enlarged lymph nodes, unspecified: Secondary | ICD-10-CM | POA: Diagnosis not present

## 2022-08-15 DIAGNOSIS — M1712 Unilateral primary osteoarthritis, left knee: Secondary | ICD-10-CM | POA: Diagnosis not present

## 2022-08-16 DIAGNOSIS — C44722 Squamous cell carcinoma of skin of right lower limb, including hip: Secondary | ICD-10-CM | POA: Diagnosis not present

## 2022-08-16 DIAGNOSIS — L570 Actinic keratosis: Secondary | ICD-10-CM | POA: Diagnosis not present

## 2022-09-15 DIAGNOSIS — E1165 Type 2 diabetes mellitus with hyperglycemia: Secondary | ICD-10-CM | POA: Diagnosis not present

## 2022-09-15 DIAGNOSIS — E1141 Type 2 diabetes mellitus with diabetic mononeuropathy: Secondary | ICD-10-CM | POA: Diagnosis not present

## 2022-09-19 ENCOUNTER — Encounter (INDEPENDENT_AMBULATORY_CARE_PROVIDER_SITE_OTHER): Payer: BC Managed Care – PPO | Admitting: Ophthalmology

## 2022-09-19 DIAGNOSIS — Z09 Encounter for follow-up examination after completed treatment for conditions other than malignant neoplasm: Secondary | ICD-10-CM | POA: Diagnosis not present

## 2022-09-19 DIAGNOSIS — Z86010 Personal history of colonic polyps: Secondary | ICD-10-CM | POA: Diagnosis not present

## 2022-09-19 DIAGNOSIS — K219 Gastro-esophageal reflux disease without esophagitis: Secondary | ICD-10-CM | POA: Diagnosis not present

## 2022-09-19 DIAGNOSIS — I10 Essential (primary) hypertension: Secondary | ICD-10-CM

## 2022-09-19 DIAGNOSIS — H35033 Hypertensive retinopathy, bilateral: Secondary | ICD-10-CM

## 2022-09-19 DIAGNOSIS — H25813 Combined forms of age-related cataract, bilateral: Secondary | ICD-10-CM

## 2022-09-19 DIAGNOSIS — E113513 Type 2 diabetes mellitus with proliferative diabetic retinopathy with macular edema, bilateral: Secondary | ICD-10-CM

## 2022-09-26 DIAGNOSIS — M1712 Unilateral primary osteoarthritis, left knee: Secondary | ICD-10-CM | POA: Diagnosis not present

## 2022-10-10 DIAGNOSIS — M25562 Pain in left knee: Secondary | ICD-10-CM | POA: Diagnosis not present

## 2022-10-18 DIAGNOSIS — M25562 Pain in left knee: Secondary | ICD-10-CM | POA: Diagnosis not present

## 2022-10-19 DIAGNOSIS — K5730 Diverticulosis of large intestine without perforation or abscess without bleeding: Secondary | ICD-10-CM | POA: Diagnosis not present

## 2022-10-19 DIAGNOSIS — D120 Benign neoplasm of cecum: Secondary | ICD-10-CM | POA: Diagnosis not present

## 2022-10-19 DIAGNOSIS — D125 Benign neoplasm of sigmoid colon: Secondary | ICD-10-CM | POA: Diagnosis not present

## 2022-10-19 DIAGNOSIS — Z86010 Personal history of colonic polyps: Secondary | ICD-10-CM | POA: Diagnosis not present

## 2023-01-18 IMAGING — RF DG ESOPHAGUS
4 series · 14 of 24 positions shown · non-contrast
Comparison: None.

CLINICAL DATA: Gastroesophageal reflux.

EXAM:
ESOPHOGRAM / BARIUM SWALLOW / BARIUM TABLET STUDY
TECHNIQUE: Combined double contrast and single contrast examination performed
using effervescent crystals, thick barium liquid, and thin barium
liquid. The patient was observed with fluoroscopy swallowing a 13 mm
barium sulphate tablet.
FLUOROSCOPY TIME:  Fluoroscopy Time:  2 minutes 0 seconds
Radiation Exposure Index (if provided by the fluoroscopic device):
37 mGy
Number of Acquired Spot Images: 14

[Series 1: sequence · 0.31mm/px · 2 of 7 frames shown (1 of 3)]
[frame 2/7]
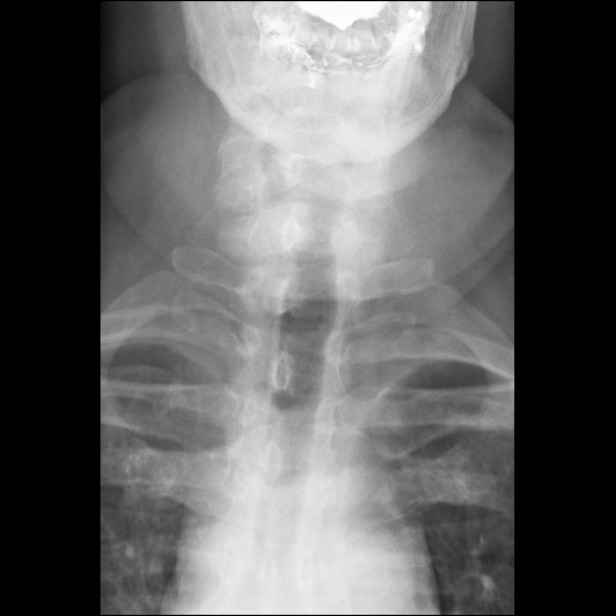
[frame 6/7]
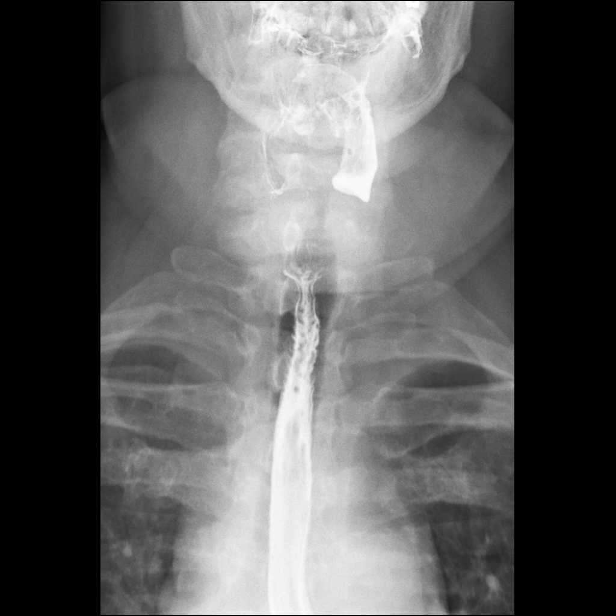

[Series 3: sequence · 0.31mm/px · 2 of 7 frames shown (2 of 3)]
[frame 4/7]
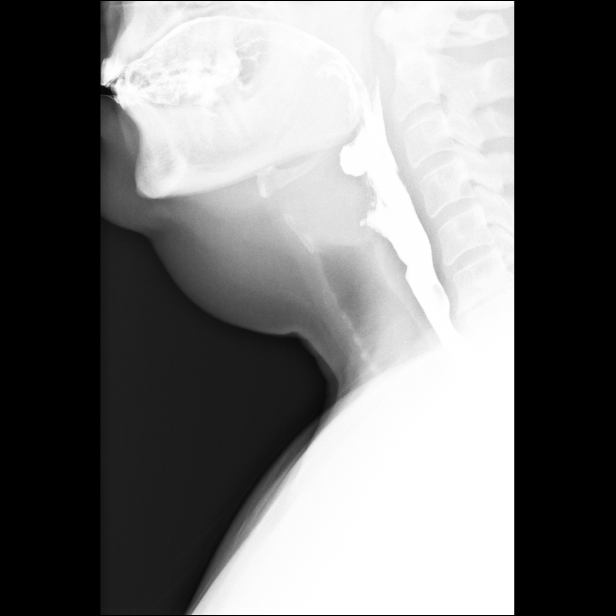
[frame 7/7]
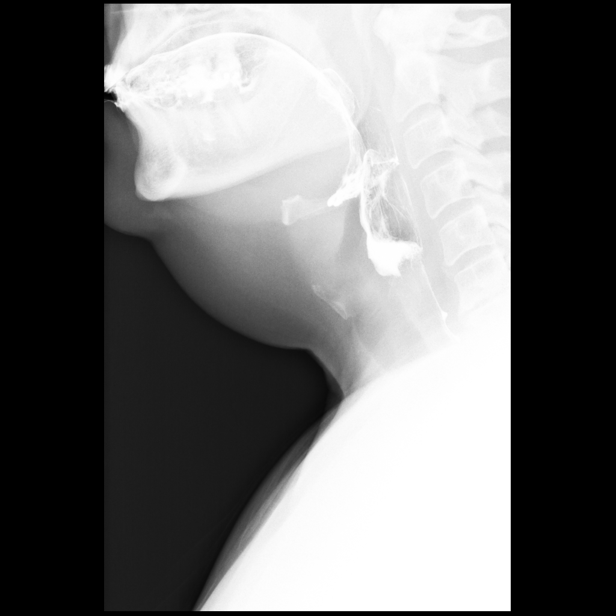

[Series 5: sequence · 1 of 1 slices shown (3 of 3)]
[im 1/1]
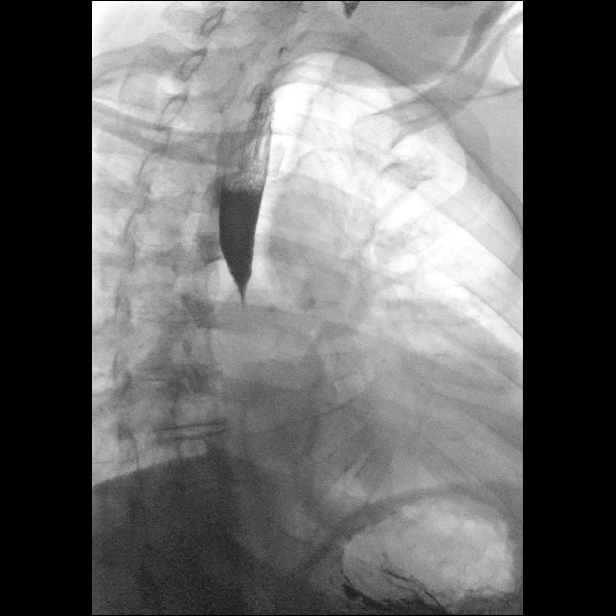

[Series 6: one shot · 9 of 28 slices shown]
[im 2/28]
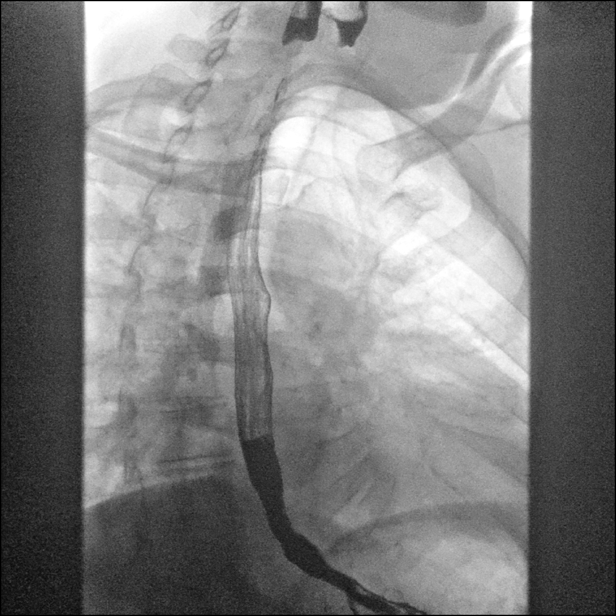
[im 5/28]
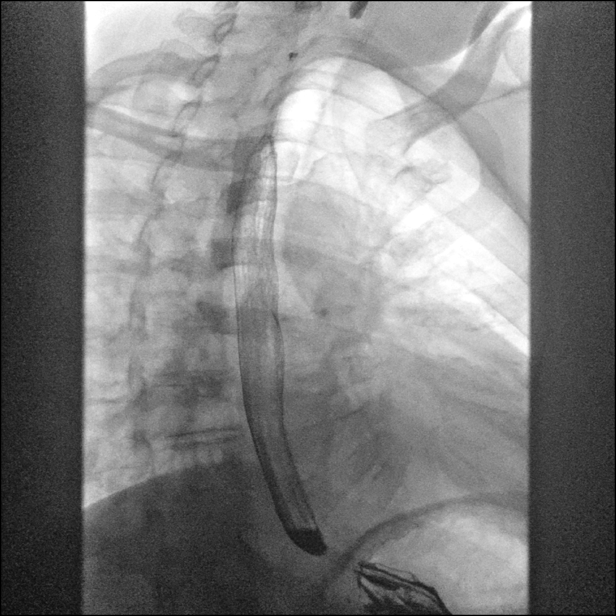
[im 8/28]
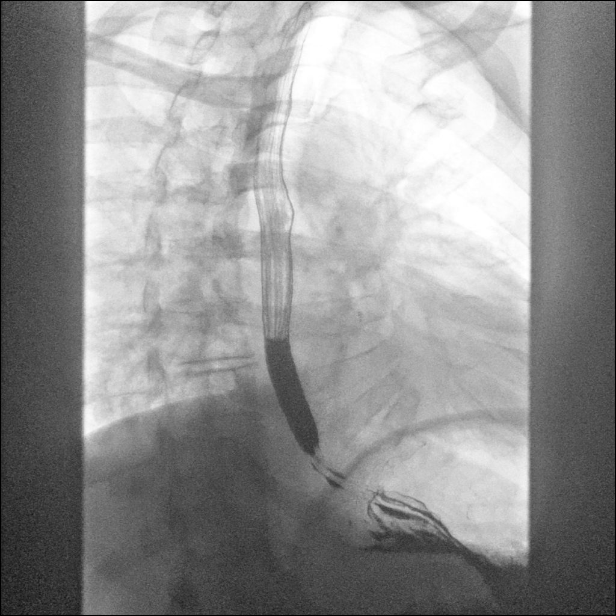
[im 11/28]
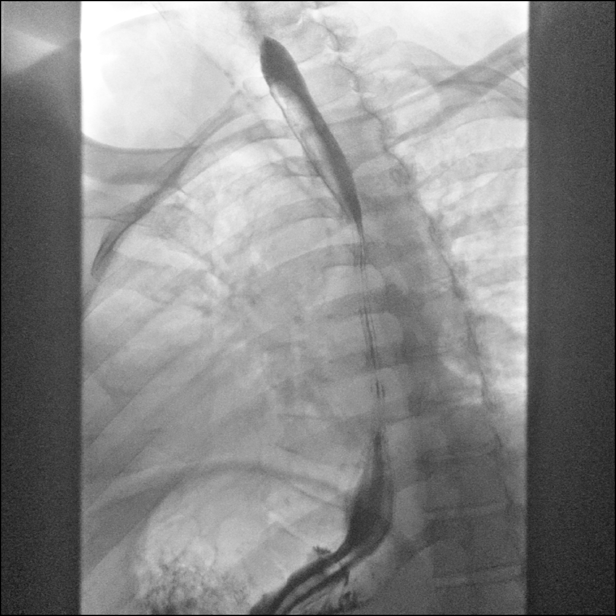
[im 14/28]
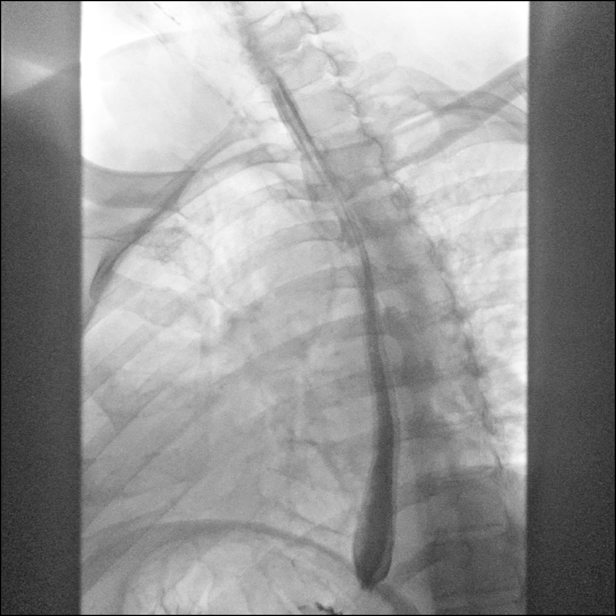
[im 19/28]
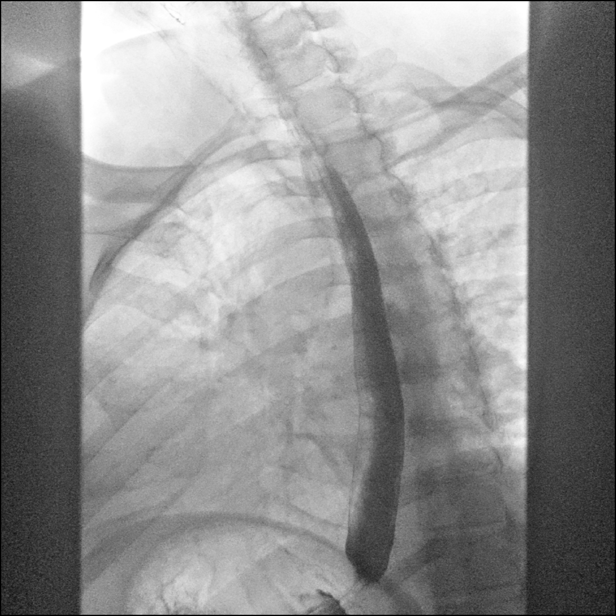
[im 20/28]
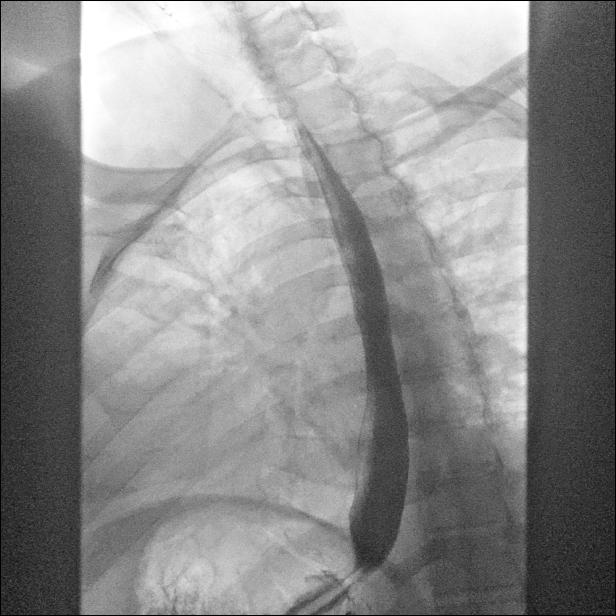
[im 25/28]
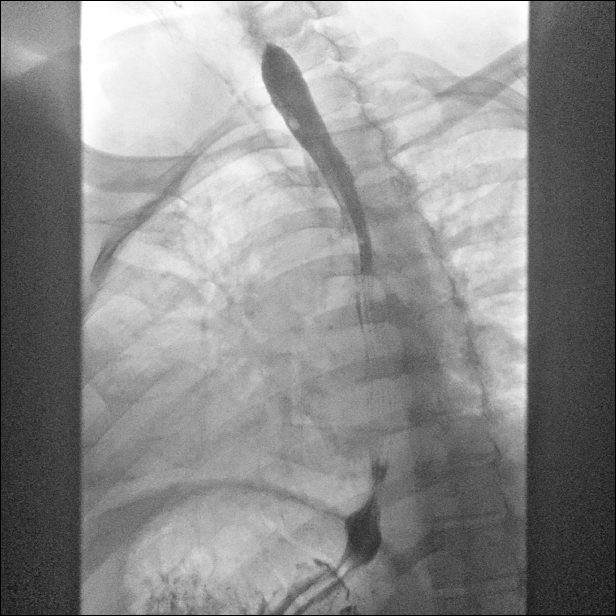
[im 28/28]
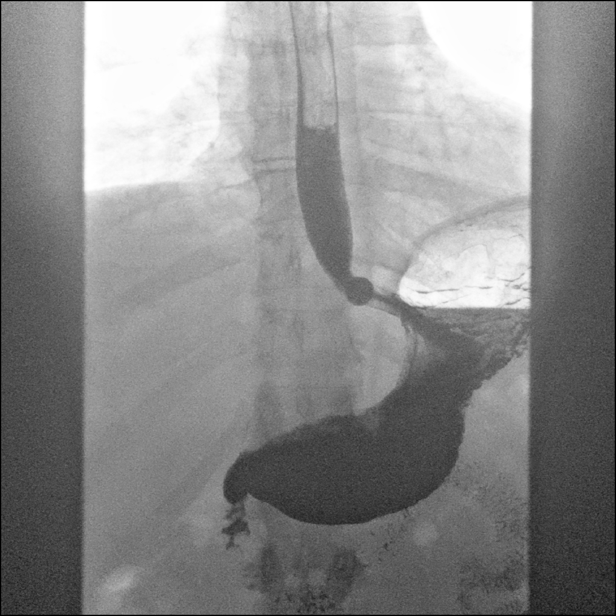

[14 of 24 positions shown; findings below may reference images not displayed]

FINDINGS: Swallowing mechanism is unremarkable. Normal esophageal motility. No
esophageal fold thickening. A 13 mm barium tablet would not pass
through the gastroesophageal junction.
IMPRESSION: Slight narrowing at the gastroesophageal junction, through which a
13 mm barium tablet would not pass.

## 2023-02-15 DIAGNOSIS — L814 Other melanin hyperpigmentation: Secondary | ICD-10-CM | POA: Diagnosis not present

## 2023-02-15 DIAGNOSIS — L570 Actinic keratosis: Secondary | ICD-10-CM | POA: Diagnosis not present

## 2023-02-15 DIAGNOSIS — Z85828 Personal history of other malignant neoplasm of skin: Secondary | ICD-10-CM | POA: Diagnosis not present

## 2023-02-15 DIAGNOSIS — D225 Melanocytic nevi of trunk: Secondary | ICD-10-CM | POA: Diagnosis not present

## 2023-03-16 DIAGNOSIS — E1165 Type 2 diabetes mellitus with hyperglycemia: Secondary | ICD-10-CM | POA: Diagnosis not present

## 2023-03-16 DIAGNOSIS — E1141 Type 2 diabetes mellitus with diabetic mononeuropathy: Secondary | ICD-10-CM | POA: Diagnosis not present

## 2023-03-20 DIAGNOSIS — E1141 Type 2 diabetes mellitus with diabetic mononeuropathy: Secondary | ICD-10-CM | POA: Diagnosis not present

## 2023-03-20 DIAGNOSIS — E1165 Type 2 diabetes mellitus with hyperglycemia: Secondary | ICD-10-CM | POA: Diagnosis not present

## 2023-03-20 DIAGNOSIS — Z794 Long term (current) use of insulin: Secondary | ICD-10-CM | POA: Diagnosis not present

## 2023-03-20 DIAGNOSIS — E113213 Type 2 diabetes mellitus with mild nonproliferative diabetic retinopathy with macular edema, bilateral: Secondary | ICD-10-CM | POA: Diagnosis not present

## 2023-03-28 DIAGNOSIS — M25511 Pain in right shoulder: Secondary | ICD-10-CM | POA: Diagnosis not present

## 2023-03-28 DIAGNOSIS — J310 Chronic rhinitis: Secondary | ICD-10-CM | POA: Diagnosis not present

## 2023-05-17 ENCOUNTER — Ambulatory Visit
Admission: EM | Admit: 2023-05-17 | Discharge: 2023-05-17 | Disposition: A | Discharge status: Home / Self Care | Payer: BLUE CROSS/BLUE SHIELD | Attending: Family Medicine | Admitting: Family Medicine

## 2023-05-17 DIAGNOSIS — M7701 Medial epicondylitis, right elbow: Secondary | ICD-10-CM

## 2023-05-17 DIAGNOSIS — M7711 Lateral epicondylitis, right elbow: Secondary | ICD-10-CM

## 2023-05-17 HISTORY — DX: Type 2 diabetes mellitus without complications: E11.9

## 2023-05-17 HISTORY — DX: Essential (primary) hypertension: I10

## 2023-05-17 MED ORDER — PREDNISONE 20 MG PO TABS: ORAL_TABLET | ORAL | 0 refills | Status: AC

## 2023-05-17 NOTE — Discharge Instructions (Signed)
 Lightly apply ace wrap around and below for 2 to 3 days to control swelling.  Apply ice pack to painful areas on elbow for 20 to 30 minutes every 4 hours for 2 to 3 days until pain decreases.  After finishing prednisone, may take Ibuprofen 200mg , 4 tabs every 8 hours with food if needed. May begin range of motion and stretching exercises.  May begin strengthening exercises when stretching is nearly painless. Avoid racket type activity until pain has resolved.

## 2023-05-17 NOTE — ED Provider Notes (Signed)
 [facility]    CSN: [id number] Arrival date & time: 05/17/23  1550      History   Chief Complaint Chief Complaint  Patient presents with   Arm Pain    HPI Jason Bowen is a 62 y.o. male.   Patient complains of onset of right elbow pain, both medially and laterally.  He plays pickleball regularly (including today) but denies injury or recent changes in his frequency of play.  His pain is intermittent, sometimes causing brief numbness in his right hand 4th and fifth fingers.  He states that he has had tennis elbow in the past which resolved after wearing a tennis elbow brace.  He denies loss of strength in his right hand.  The history is provided by the patient and the spouse.    Past Medical History:  Diagnosis Date   Diabetes mellitus without complication (HCC)    Hypertension     There are no active problems to display for this patient.   History reviewed. No pertinent surgical history.     Home Medications    Prior to Admission medications   Medication Sig Start Date End Date Taking? Authorizing Provider  dapagliflozin propanediol (FARXIGA) 10 MG TABS tablet Take by mouth daily.   Yes [provider]  insulin degludec (TRESIBA FLEXTOUCH) 200 UNIT/ML FlexTouch Pen Inject into the skin. 04/25/18  Yes [provider]  insulin lispro (HUMALOG KWIKPEN) 200 UNIT/ML KwikPen Inject into the skin. 04/24/18  Yes [provider]  lisinopril (ZESTRIL) 20 MG tablet Take 20 mg by mouth daily. 04/24/18  Yes [provider]  predniSONE (DELTASONE) 20 MG tablet Take one tab by mouth twice daily for 4 days, then one daily. Take with food. 05/17/23  Yes Lattie Haw, MD  tirzepatide [facility]) 2.5 MG/0.5ML Pen Inject 2.5 mg into the skin once a week.   Yes [provider]    Family History History reviewed. No pertinent family history.  Social History Social History   Tobacco Use   Smoking status: Never   Smokeless  tobacco: Never  Vaping Use   Vaping status: Never Used     Allergies   Patient has no known allergies.   Review of Systems Review of Systems  Constitutional:  Negative for chills, fatigue and fever.  Musculoskeletal:  Negative for arthralgias and joint swelling.  Skin:  Negative for color change.  Neurological:  Positive for numbness.  All other systems reviewed and are negative.    Physical Exam Triage Vital Signs ED Triage Vitals  Encounter Vitals Group     BP 05/17/23 1600 (!) 156/81     Systolic BP Percentile --      Diastolic BP Percentile --      Pulse Rate 05/17/23 1600 69     Resp 05/17/23 1600 18     Temp 05/17/23 1600 97.9 F (36.6 C)     Temp Source 05/17/23 1600 Oral     SpO2 05/17/23 1600 100 %     Weight --      Height --      Head Circumference --      Peak Flow --      Pain Score 05/17/23 1601 5     Pain Loc --      Pain Education --      Exclude from Growth Chart --    No data found.  Updated Vital Signs BP (!) 156/81 (BP Location: Right Arm)   Pulse 69   Temp  97.9 F (36.6 C) (Oral)   Resp 18   SpO2 100%   Visual Acuity Right Eye Distance:   Left Eye Distance:   Bilateral Distance:    Right Eye Near:   Left Eye Near:    Bilateral Near:     Physical Exam Vitals and nursing note reviewed.  Constitutional:      General: He is not in acute distress.    Appearance: He is not ill-appearing.  HENT:     Head: Normocephalic.  Eyes:     Pupils: Pupils are equal, round, and reactive to light.  Cardiovascular:     Rate and Rhythm: Normal rate.  Pulmonary:     Effort: Pulmonary effort is normal.  Musculoskeletal:     Right elbow: No swelling, deformity or effusion. Normal range of motion. Tenderness present.       Arms:     Comments: Right elbow has full range of motion.  Minimal, if any, swelling.  There is tenderness to palpation laterally over the insertion of common extensor tendon.  There is tenderness to palpation medially over  the insertion of common flexor tendons. Pain is recreated with both resisted dorsiflexion and palmar flexion of the wrist, and both resisted supination and pronation of the wrist.  There is mild tenderness to palpation over the proximal flexor and extensor tendons. Distal neurovascular function is intact.     Skin:    General: Skin is warm and dry.     Findings: No erythema.  Neurological:     General: No focal deficit present.     Mental Status: He is alert.     Sensory: No sensory deficit.     Motor: No weakness.      UC Treatments / Results  Labs (all labs ordered are listed, but only abnormal results are displayed) Labs Reviewed - No data to display  EKG   Radiology No results found.  Procedures Procedures (including critical care time)  Medications Ordered in UC Medications - No data to display  Initial Impression / Assessment and Plan / UC Course  I have reviewed the triage vital signs and the nursing notes.  Pertinent labs & imaging results that were available during my care of the patient were reviewed by me and considered in my medical decision making (see chart for details).    Begin prednisone burst/taper. Given treatment instructions for both conditions with range of motion, stretching, and strengthening exercises.  Followup with [facility] if not improving about two weeks.   Final Clinical Impressions(s) / UC Diagnoses   Final diagnoses:  Lateral epicondylitis of right elbow  Medial epicondylitis of right elbow     Discharge Instructions      Lightly apply ace wrap around and below for 2 to 3 days to control swelling.  Apply ice pack to painful areas on elbow for 20 to 30 minutes every 4 hours for 2 to 3 days until pain decreases.  After finishing prednisone, may take Ibuprofen 200mg , 4 tabs every 8 hours with food if needed. May begin range of motion and stretching exercises.  May begin strengthening exercises when stretching is nearly  painless. Avoid racket type activity until pain has resolved.    ED Prescriptions     Medication Sig Dispense Auth. Provider   predniSONE (DELTASONE) 20 MG tablet Take one tab by mouth twice daily for 4 days, then one daily. Take with food. 12 tablet Lattie Haw, MD  Lattie Haw, MD 05/17/23 [id number]

## 2023-05-17 NOTE — ED Triage Notes (Signed)
 Pt with c/o right elbow and forearm pain. Unsure of any injury but states he plays pickleball frequently.

## 2023-05-18 ENCOUNTER — Encounter (INDEPENDENT_AMBULATORY_CARE_PROVIDER_SITE_OTHER): Payer: Self-pay | Admitting: Ophthalmology

## 2023-05-18 ENCOUNTER — Ambulatory Visit: Payer: Self-pay

## 2023-05-24 DIAGNOSIS — G5621 Lesion of ulnar nerve, right upper limb: Secondary | ICD-10-CM | POA: Diagnosis not present

## 2023-06-23 DIAGNOSIS — G5621 Lesion of ulnar nerve, right upper limb: Secondary | ICD-10-CM | POA: Diagnosis not present

## 2023-06-30 DIAGNOSIS — G5621 Lesion of ulnar nerve, right upper limb: Secondary | ICD-10-CM | POA: Diagnosis not present

## 2023-07-05 DIAGNOSIS — G5621 Lesion of ulnar nerve, right upper limb: Secondary | ICD-10-CM | POA: Diagnosis not present

## 2023-07-12 DIAGNOSIS — M25621 Stiffness of right elbow, not elsewhere classified: Secondary | ICD-10-CM | POA: Diagnosis not present

## 2023-07-18 DIAGNOSIS — M25621 Stiffness of right elbow, not elsewhere classified: Secondary | ICD-10-CM | POA: Diagnosis not present

## 2023-08-17 DIAGNOSIS — Z85828 Personal history of other malignant neoplasm of skin: Secondary | ICD-10-CM | POA: Diagnosis not present

## 2023-08-17 DIAGNOSIS — D225 Melanocytic nevi of trunk: Secondary | ICD-10-CM | POA: Diagnosis not present

## 2023-08-17 DIAGNOSIS — D2261 Melanocytic nevi of right upper limb, including shoulder: Secondary | ICD-10-CM | POA: Diagnosis not present

## 2023-08-17 DIAGNOSIS — L814 Other melanin hyperpigmentation: Secondary | ICD-10-CM | POA: Diagnosis not present

## 2023-08-17 DIAGNOSIS — L570 Actinic keratosis: Secondary | ICD-10-CM | POA: Diagnosis not present

## 2023-09-04 DIAGNOSIS — H31093 Other chorioretinal scars, bilateral: Secondary | ICD-10-CM | POA: Diagnosis not present

## 2023-09-04 DIAGNOSIS — E113513 Type 2 diabetes mellitus with proliferative diabetic retinopathy with macular edema, bilateral: Secondary | ICD-10-CM | POA: Diagnosis not present

## 2023-09-04 DIAGNOSIS — H25813 Combined forms of age-related cataract, bilateral: Secondary | ICD-10-CM | POA: Diagnosis not present

## 2023-09-20 DIAGNOSIS — H25813 Combined forms of age-related cataract, bilateral: Secondary | ICD-10-CM | POA: Diagnosis not present

## 2023-09-21 DIAGNOSIS — E1141 Type 2 diabetes mellitus with diabetic mononeuropathy: Secondary | ICD-10-CM | POA: Diagnosis not present

## 2023-09-21 DIAGNOSIS — E1165 Type 2 diabetes mellitus with hyperglycemia: Secondary | ICD-10-CM | POA: Diagnosis not present

## 2023-09-25 DIAGNOSIS — Z6833 Body mass index (BMI) 33.0-33.9, adult: Secondary | ICD-10-CM | POA: Diagnosis not present

## 2023-09-25 DIAGNOSIS — Z794 Long term (current) use of insulin: Secondary | ICD-10-CM | POA: Diagnosis not present

## 2023-09-25 DIAGNOSIS — E1165 Type 2 diabetes mellitus with hyperglycemia: Secondary | ICD-10-CM | POA: Diagnosis not present

## 2023-09-25 DIAGNOSIS — E1141 Type 2 diabetes mellitus with diabetic mononeuropathy: Secondary | ICD-10-CM | POA: Diagnosis not present

## 2023-10-10 DIAGNOSIS — H268 Other specified cataract: Secondary | ICD-10-CM | POA: Diagnosis not present

## 2023-10-10 DIAGNOSIS — E119 Type 2 diabetes mellitus without complications: Secondary | ICD-10-CM | POA: Diagnosis not present

## 2023-10-10 DIAGNOSIS — H25811 Combined forms of age-related cataract, right eye: Secondary | ICD-10-CM | POA: Diagnosis not present

## 2023-10-16 DIAGNOSIS — H25812 Combined forms of age-related cataract, left eye: Secondary | ICD-10-CM | POA: Diagnosis not present

## 2023-12-08 DIAGNOSIS — M79671 Pain in right foot: Secondary | ICD-10-CM | POA: Diagnosis not present

## 2023-12-12 DIAGNOSIS — H52222 Regular astigmatism, left eye: Secondary | ICD-10-CM | POA: Diagnosis not present

## 2023-12-12 DIAGNOSIS — H25812 Combined forms of age-related cataract, left eye: Secondary | ICD-10-CM | POA: Diagnosis not present

## 2023-12-12 DIAGNOSIS — H268 Other specified cataract: Secondary | ICD-10-CM | POA: Diagnosis not present

## 2023-12-12 DIAGNOSIS — E119 Type 2 diabetes mellitus without complications: Secondary | ICD-10-CM | POA: Diagnosis not present

## 2023-12-14 DIAGNOSIS — Q6671 Congenital pes cavus, right foot: Secondary | ICD-10-CM | POA: Diagnosis not present

## 2023-12-14 DIAGNOSIS — Q6672 Congenital pes cavus, left foot: Secondary | ICD-10-CM | POA: Diagnosis not present

## 2023-12-14 DIAGNOSIS — E1142 Type 2 diabetes mellitus with diabetic polyneuropathy: Secondary | ICD-10-CM | POA: Diagnosis not present

## 2023-12-22 DIAGNOSIS — R269 Unspecified abnormalities of gait and mobility: Secondary | ICD-10-CM | POA: Diagnosis not present

## 2023-12-22 DIAGNOSIS — M6701 Short Achilles tendon (acquired), right ankle: Secondary | ICD-10-CM | POA: Diagnosis not present

## 2023-12-22 DIAGNOSIS — M79671 Pain in right foot: Secondary | ICD-10-CM | POA: Diagnosis not present

## 2024-01-01 DIAGNOSIS — R269 Unspecified abnormalities of gait and mobility: Secondary | ICD-10-CM | POA: Diagnosis not present

## 2024-01-01 DIAGNOSIS — M79671 Pain in right foot: Secondary | ICD-10-CM | POA: Diagnosis not present

## 2024-01-01 DIAGNOSIS — M6701 Short Achilles tendon (acquired), right ankle: Secondary | ICD-10-CM | POA: Diagnosis not present

## 2024-01-03 DIAGNOSIS — R269 Unspecified abnormalities of gait and mobility: Secondary | ICD-10-CM | POA: Diagnosis not present

## 2024-01-03 DIAGNOSIS — M6701 Short Achilles tendon (acquired), right ankle: Secondary | ICD-10-CM | POA: Diagnosis not present

## 2024-01-03 DIAGNOSIS — M79671 Pain in right foot: Secondary | ICD-10-CM | POA: Diagnosis not present

## 2024-02-06 DIAGNOSIS — L570 Actinic keratosis: Secondary | ICD-10-CM | POA: Diagnosis not present

## 2024-02-06 DIAGNOSIS — D225 Melanocytic nevi of trunk: Secondary | ICD-10-CM | POA: Diagnosis not present

## 2024-02-06 DIAGNOSIS — Z85828 Personal history of other malignant neoplasm of skin: Secondary | ICD-10-CM | POA: Diagnosis not present

## 2024-03-26 DIAGNOSIS — E1141 Type 2 diabetes mellitus with diabetic mononeuropathy: Secondary | ICD-10-CM | POA: Diagnosis not present

## 2024-03-26 DIAGNOSIS — E1165 Type 2 diabetes mellitus with hyperglycemia: Secondary | ICD-10-CM | POA: Diagnosis not present
